# Patient Record
Sex: Female | Born: 1995 | Race: Black or African American | Hispanic: No | Marital: Single | Smoking: Never smoker
Health system: Southern US, Community
[De-identification: ages and names within clinical notes are randomized; demographics above are authoritative.]

## PROBLEM LIST (undated history)

## (undated) DIAGNOSIS — R519 Headache, unspecified: Secondary | ICD-10-CM

## (undated) DIAGNOSIS — O26649 Intrahepatic cholestasis of pregnancy, unspecified trimester: Secondary | ICD-10-CM

## (undated) DIAGNOSIS — F902 Attention-deficit hyperactivity disorder, combined type: Secondary | ICD-10-CM

## (undated) DIAGNOSIS — A599 Trichomoniasis, unspecified: Secondary | ICD-10-CM

## (undated) DIAGNOSIS — R51 Headache: Secondary | ICD-10-CM

## (undated) DIAGNOSIS — K831 Obstruction of bile duct: Secondary | ICD-10-CM

## (undated) DIAGNOSIS — J45909 Unspecified asthma, uncomplicated: Secondary | ICD-10-CM

## (undated) DIAGNOSIS — J309 Allergic rhinitis, unspecified: Secondary | ICD-10-CM

## (undated) DIAGNOSIS — L709 Acne, unspecified: Secondary | ICD-10-CM

## (undated) DIAGNOSIS — A6009 Herpesviral infection of other urogenital tract: Secondary | ICD-10-CM

## (undated) HISTORY — DX: Allergic rhinitis, unspecified: J30.9

## (undated) HISTORY — DX: Intrahepatic cholestasis of pregnancy, unspecified trimester: O26.649

## (undated) HISTORY — DX: Unspecified asthma, uncomplicated: J45.909

## (undated) HISTORY — DX: Headache, unspecified: R51.9

## (undated) HISTORY — PX: NO PAST SURGERIES: SHX2092

## (undated) HISTORY — DX: Acne, unspecified: L70.9

## (undated) HISTORY — DX: Attention-deficit hyperactivity disorder, combined type: F90.2

## (undated) HISTORY — DX: Herpesviral infection of other urogenital tract: A60.09

## (undated) HISTORY — DX: Headache: R51

## (undated) HISTORY — DX: Obstruction of bile duct: K83.1

## (undated) HISTORY — DX: Trichomoniasis, unspecified: A59.9

---
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
## 2017-01-27 ENCOUNTER — Ambulatory Visit: Payer: No Typology Code available for payment source | Admitting: Pediatrics

## 2017-03-25 ENCOUNTER — Ambulatory Visit
Admission: EM | Admit: 2017-03-25 | Discharge: 2017-03-25 | Disposition: A | Discharge status: Home / Self Care | Payer: No Typology Code available for payment source | Attending: Family Medicine | Admitting: Family Medicine

## 2017-03-25 DIAGNOSIS — Z975 Presence of (intrauterine) contraceptive device: Secondary | ICD-10-CM | POA: Diagnosis not present

## 2017-03-25 DIAGNOSIS — N939 Abnormal uterine and vaginal bleeding, unspecified: Secondary | ICD-10-CM

## 2017-03-25 DIAGNOSIS — N938 Other specified abnormal uterine and vaginal bleeding: Secondary | ICD-10-CM | POA: Diagnosis not present

## 2017-03-25 LAB — CBC WITH DIFFERENTIAL/PLATELET
BASOS ABS: 0.1 10*3/uL (ref 0–0.1)
Basophils Relative: 1 %
EOS ABS: 0.2 10*3/uL (ref 0–0.7)
Eosinophils Relative: 3 %
HCT: 39.1 % (ref 35.0–47.0)
HEMOGLOBIN: 13 g/dL (ref 12.0–16.0)
LYMPHS ABS: 2 10*3/uL (ref 1.0–3.6)
LYMPHS PCT: 22 %
MCH: 30.9 pg (ref 26.0–34.0)
MCHC: 33.2 g/dL (ref 32.0–36.0)
MCV: 92.9 fL (ref 80.0–100.0)
Monocytes Absolute: 0.9 10*3/uL (ref 0.2–0.9)
Monocytes Relative: 10 %
NEUTROS PCT: 64 %
Neutro Abs: 5.8 10*3/uL (ref 1.4–6.5)
PLATELETS: 229 10*3/uL (ref 150–440)
RBC: 4.21 MIL/uL (ref 3.80–5.20)
RDW: 12.9 % (ref 11.5–14.5)
WBC: 9 10*3/uL (ref 3.6–11.0)

## 2017-03-25 LAB — CHLAMYDIA/NGC RT PCR (ARMC ONLY)
CHLAMYDIA TR: NOT DETECTED
N gonorrhoeae: NOT DETECTED

## 2017-03-25 LAB — WET PREP, GENITAL
Clue Cells Wet Prep HPF POC: NONE SEEN
Sperm: NONE SEEN
Trich, Wet Prep: NONE SEEN
Yeast Wet Prep HPF POC: NONE SEEN

## 2017-03-25 LAB — HCG, QUANTITATIVE, PREGNANCY

## 2017-03-25 NOTE — ED Triage Notes (Signed)
Patient complains of vaginal bleeding since Sunday. Patient reports that she has a Nexplanon in and does not typically have menstrual cycles. Patient states that she has been bleeding constantly filling up pads and tampons every few hours. Patient states that she has been having cramps. Patient states that this seems different than menstrual cycles that she has had in the past.

## 2017-03-25 NOTE — ED Provider Notes (Signed)
[facility]    CSN: [id number] Arrival date & time: 03/25/17  4098     History   Chief Complaint Chief Complaint  Patient presents with  . Vaginal Bleeding    HPI Jasmine Thornton is a 21 y.o. female.   She is 21 year old black female gravida 0 para 0 presents with abnormal uterine/vaginal bleeding since Sunday. She reports that. His been very heavy. She reports having a subdermal implant for contraception for about 2 years. Usually they're good for 3-5 years in the first year she had a lot of breakthrough bleeding in the last year she's had no bleeding at all. She suddenly started having bleeding on Sunday. Along with vaginal bleeding she reports heavy menstrual cramping as well She is sexually active diagnosis slight discharge before the bleeding started and was having some abdominal discomfort as well so she was be also evaluated for STDs. Past smoker history ADHD acne asthma and headaches. No previous surgeries or operations. Mother allergies and anemia she never smoked.   The history is provided by the patient.  Vaginal Bleeding  Quality:  Bright red and clots Severity:  Severe Onset quality:  Sudden Duration:  3 days Timing:  Constant Progression:  Worsening Chronicity:  New Menstrual history:  Irregular Possible pregnancy: no   Relieved by:  Nothing Worsened by:  Nothing Ineffective treatments:  None tried Associated symptoms: abdominal pain, dyspareunia and vaginal discharge   Risk factors: STD exposure     Past Medical History:  Diagnosis Date  . Acne   . ADHD (attention deficit hyperactivity disorder), combined type   . Allergic rhinitis   . Asthma   . Headache     Patient Active Problem List   Diagnosis Date Noted  . Surveillance of implantable subdermal contraceptive 07/24/2015    Past Surgical History:  Procedure Laterality Date  . NO PAST SURGERIES      OB History    No data available       Home Medications    Prior to Admission  medications   Medication Sig Start Date End Date Taking? Authorizing Provider  Adapalene (DIFFERIN) 0.1 % LOTN Apply topically.   Yes [provider]  albuterol (PROVENTIL HFA;VENTOLIN HFA) 108 (90 BASE) MCG/ACT inhaler Inhale into the lungs every 6 (six) hours as needed for wheezing or shortness of breath.   Yes [provider]  beclomethasone (QVAR) 80 MCG/ACT inhaler Inhale into the lungs 2 (two) times daily.   Yes [provider]  cetirizine (ZYRTEC) 10 MG tablet Take 10 mg by mouth at bedtime. 06/05/15  Yes [provider]  fluticasone (FLONASE) 50 MCG/ACT nasal spray Place 1 spray into both nostrils daily. 04/01/15  Yes [provider]  clindamycin-benzoyl peroxide (BENZACLIN) gel Apply topically 2 (two) times daily.    [provider]  PATADAY 0.2 % SOLN PLACE 1 DROP INTO BOTH EYES EVERY DAY AS NEEDED 06/05/15   [provider]  predniSONE (DELTASONE) 20 MG tablet 3 tabs po day one, then 2 po daily x 4 days 03/05/16   Palumbo, April, MD  SUMAtriptan (IMITREX) 50 MG tablet Take 1 tab po at onset of headache. May repeat in 2 hours if headache persists or recurs. No more than 2 tabs in 24 hour period. 12/24/16   Payton Mccallum, MD    Family History Family History  Problem Relation Age of Onset  . Allergies Mother   . Anemia Mother   . Heart disease Maternal Grandmother  Social History Social History  Substance Use Topics  . Smoking status: Never Smoker  . Smokeless tobacco: Never Used  . Alcohol use No     Allergies   Patient has no known allergies.   Review of Systems Review of Systems  Gastrointestinal: Positive for abdominal pain.  Genitourinary: Positive for dyspareunia, vaginal bleeding and vaginal discharge.  All other systems reviewed and are negative.    Physical Exam Triage Vital Signs ED Triage Vitals  Enc Vitals Group     BP 03/25/17 0950 100/62     Pulse Rate 03/25/17 0950 87     Resp  03/25/17 0950 18     Temp 03/25/17 0950 98.3 F (36.8 C)     Temp Source 03/25/17 0950 Oral     SpO2 03/25/17 0950 100 %     Weight 03/25/17 0946 126 lb (57.2 kg)     Height 03/25/17 0946 5\' 2"  (1.575 m)     Head Circumference --      Peak Flow --      Pain Score 03/25/17 0946 7     Pain Loc --      Pain Edu? --      Excl. in GC? --    No data found.   Updated Vital Signs BP 100/62 (BP Location: Left Arm)   Pulse 87   Temp 98.3 F (36.8 C) (Oral)   Resp 18   Ht 5\' 2"  (1.575 m)   Wt 126 lb (57.2 kg)   SpO2 100%   BMI 23.05 kg/m   Visual Acuity Right Eye Distance:   Left Eye Distance:   Bilateral Distance:    Right Eye Near:   Left Eye Near:    Bilateral Near:     Physical Exam  Constitutional: She appears well-developed and well-nourished.  HENT:  Head: Normocephalic and atraumatic.  Eyes: Pupils are equal, round, and reactive to light.  Neck: Normal range of motion. Neck supple.  Pulmonary/Chest: Effort normal.  Abdominal: Soft. Bowel sounds are normal. She exhibits no distension. There is no tenderness. Hernia confirmed negative in the right inguinal area and confirmed negative in the left inguinal area.  Genitourinary: Rectum normal and uterus normal. Rectal exam shows no fissure and no tenderness. There is no rash on the right labia. There is no rash on the left labia. Uterus is not deviated and not enlarged. Cervix exhibits no motion tenderness and no friability. Right adnexum displays no mass and no tenderness. Left adnexum displays no mass and no tenderness. There is bleeding in the vagina. No signs of injury around the vagina.  Musculoskeletal: Normal range of motion.  Neurological: She is alert.  Skin: Skin is warm and dry.  Psychiatric: She has a normal mood and affect.  Vitals reviewed.    UC Treatments / Results  Labs (all labs ordered are listed, but only abnormal results are displayed) Labs Reviewed  WET PREP, GENITAL  CHLAMYDIA/NGC RT PCR  ([facility] ONLY)  CBC WITH DIFFERENTIAL/PLATELET  HCG, QUANTITATIVE, PREGNANCY  RPR  HIV ANTIBODY (ROUTINE TESTING)    EKG  EKG Interpretation None       Radiology No results found.  Procedures Procedures (including critical care time)  Medications Ordered in UC Medications - No data to display   Results for orders placed or performed during the hospital encounter of 03/25/17  Wet prep, genital  Result Value Ref Range   Yeast Wet Prep HPF POC NONE SEEN NONE SEEN   Trich, Wet Prep NONE  SEEN NONE SEEN   Clue Cells Wet Prep HPF POC NONE SEEN NONE SEEN   WBC, Wet Prep HPF POC FEW (A) NONE SEEN   Sperm NONE SEEN   CBC with Differential  Result Value Ref Range   WBC 9.0 3.6 - 11.0 K/uL   RBC 4.21 3.80 - 5.20 MIL/uL   Hemoglobin 13.0 12.0 - 16.0 g/dL   HCT 40.939.1 81.135.0 - 91.447.0 %   MCV 92.9 80.0 - 100.0 fL   MCH 30.9 26.0 - 34.0 pg   MCHC 33.2 32.0 - 36.0 g/dL   RDW 78.212.9 95.611.5 - 21.314.5 %   Platelets 229 150 - 440 K/uL   Neutrophils Relative % 64 %   Neutro Abs 5.8 1.4 - 6.5 K/uL   Lymphocytes Relative 22 %   Lymphs Abs 2.0 1.0 - 3.6 K/uL   Monocytes Relative 10 %   Monocytes Absolute 0.9 0.2 - 0.9 K/uL   Eosinophils Relative 3 %   Eosinophils Absolute 0.2 0 - 0.7 K/uL   Basophils Relative 1 %   Basophils Absolute 0.1 0 - 0.1 K/uL  hCG, quantitative, pregnancy  Result Value Ref Range   hCG, Beta Chain, Quant, S <1 <5 mIU/mL   Initial Impression / Assessment and Plan / UC Course  I have reviewed the triage vital signs and the nursing notes.  Pertinent labs & imaging results that were available during my care of the patient were reviewed by me and considered in my medical decision making (see chart for details).   at this time patient has vaginal bleeding but not pregnant and is no signs of marked anemia. GC and chlamydia test as well as HIV and RPR is pending. Recommend patient follow-up with PCP if not better in 2-3 weeks work today and tomorrow as well.  Final  Clinical Impressions(s) / UC Diagnoses   Final diagnoses:  Vaginal bleeding  Dysfunctional uterine bleeding    New Prescriptions New Prescriptions   No medications on file    Note: This dictation was prepared with Dragon dictation along with smaller phrase technology. Any transcriptional errors that result from this process are unintentional.   Hassan RowanWade, Timur Nibert, MD 03/25/17 1143

## 2017-03-26 LAB — RPR: RPR: NONREACTIVE

## 2017-03-26 LAB — HIV ANTIBODY (ROUTINE TESTING W REFLEX): HIV Screen 4th Generation wRfx: NONREACTIVE

## 2017-08-26 ENCOUNTER — Ambulatory Visit (INDEPENDENT_AMBULATORY_CARE_PROVIDER_SITE_OTHER): Admitting: Family

## 2017-08-26 ENCOUNTER — Encounter: Payer: Self-pay | Admitting: Family

## 2017-08-26 VITALS — BP 111/66 | HR 76 | Ht 62.25 in | Wt 138.8 lb

## 2017-08-26 DIAGNOSIS — Z113 Encounter for screening for infections with a predominantly sexual mode of transmission: Secondary | ICD-10-CM

## 2017-08-26 DIAGNOSIS — Z3046 Encounter for surveillance of implantable subdermal contraceptive: Secondary | ICD-10-CM

## 2017-08-26 NOTE — Patient Instructions (Signed)
Follow-up  in 1 month. Schedule this appointment before you leave clinic today.  Your Nexplanon was removed today and is no longer preventing pregnancy.  If you have sex, remember to use condoms to prevent pregnancy and to prevent sexually transmitted infections.  Leave the outside bandage on for 24 hours.  Leave the smaller bandages on for 3-5 days or until they fall off on their own.  Keep the area clean and dry for 3-5 days.  There is usually bruising or swelling at and around the removal site for a few days to a week after the removal.  If you see redness or pus draining from the removal site, call us immediately.  We would like you to return to the clinic for a follow-up visit in 1 month.  You can call [facility] 24 hours a day with any questions or concerns.  There is always a nurse or doctor available to take your call.  Call 9-1-1 if you have a life-threatening emergency.  For anything else, please call us at [phone] before heading to the ER.  

## 2017-08-27 LAB — C. TRACHOMATIS/N. GONORRHOEAE RNA
C. TRACHOMATIS RNA, TMA: NOT DETECTED
N. gonorrhoeae RNA, TMA: NOT DETECTED

## 2017-09-04 NOTE — Progress Notes (Signed)
THIS RECORD MAY CONTAIN CONFIDENTIAL INFORMATION THAT SHOULD NOT BE RELEASED WITHOUT REVIEW OF THE SERVICE PROVIDER.  Adolescent Medicine Consultation Follow-Up Visit Jasmine Thornton  is a 21 y.o. female referred by Melanie CrazierKramer, Minda, NP here today for follow-up regarding nexplanon removal.      Chief Complaint  Patient presents with  . Follow-up    HPI:    -not sexually active -desires nexplanon removal -declines another method at this time -no coercion; safe in all relationships  Review of Systems  Constitutional: Negative for malaise/fatigue.  Eyes: Negative for double vision.  Respiratory: Negative for shortness of breath.   Cardiovascular: Negative for chest pain and palpitations.  Gastrointestinal: Negative for abdominal pain, constipation, diarrhea, nausea and vomiting.  Genitourinary: Negative for dysuria.  Musculoskeletal: Negative for joint pain and myalgias.  Skin: Negative for rash.  Neurological: Negative for dizziness and headaches.  Endo/Heme/Allergies: Does not bruise/bleed easily.    No LMP recorded. Patient has had an implant. No Known Allergies Outpatient Medications Prior to Visit  Medication Sig Dispense Refill  . Adapalene (DIFFERIN) 0.1 % LOTN Apply topically.    Marland Kitchen. albuterol (PROVENTIL HFA;VENTOLIN HFA) 108 (90 BASE) MCG/ACT inhaler Inhale into the lungs every 6 (six) hours as needed for wheezing or shortness of breath.    . beclomethasone (QVAR) 80 MCG/ACT inhaler Inhale into the lungs 2 (two) times daily.    . cetirizine (ZYRTEC) 10 MG tablet Take 10 mg by mouth at bedtime.  12  . clindamycin-benzoyl peroxide (BENZACLIN) gel Apply topically 2 (two) times daily.    . fluticasone (FLONASE) 50 MCG/ACT nasal spray Place 1 spray into both nostrils daily.  12  . PATADAY 0.2 % SOLN PLACE 1 DROP INTO BOTH EYES EVERY DAY AS NEEDED  12  . predniSONE (DELTASONE) 20 MG tablet 3 tabs po day one, then 2 po daily x 4 days 11 tablet 0  . SUMAtriptan (IMITREX) 50 MG  tablet Take 1 tab po at onset of headache. May repeat in 2 hours if headache persists or recurs. No more than 2 tabs in 24 hour period. 6 tablet 0   No facility-administered medications prior to visit.      Patient Active Problem List   Diagnosis Date Noted  . Surveillance of implantable subdermal contraceptive 07/24/2015    Physical Exam:  Vitals:   08/26/17 1131  BP: 111/66  Pulse: 76  Weight: 138 lb 12.8 oz (63 kg)  Height: 5' 2.25" (1.581 m)   BP 111/66 (BP Location: Right Arm, Patient Position: Sitting, Cuff Size: Normal)   Pulse 76   Ht 5' 2.25" (1.581 m)   Wt 138 lb 12.8 oz (63 kg)   BMI 25.18 kg/m  Body mass index: body mass index is 25.18 kg/m. Blood pressure percentiles are 48 % systolic and 54 % diastolic based on the August 2017 AAP Clinical Practice Guideline. Blood pressure percentile targets: 90: 125/77, 95: 128/80, 95 + 12 mmHg: 140/92.  Physical Exam  Constitutional: She is oriented to person, place, and time. She appears well-developed. No distress.  HENT:  Head: Normocephalic and atraumatic.  Eyes: Pupils are equal, round, and reactive to light. EOM are normal. No scleral icterus.  Neck: Normal range of motion. Neck supple. No thyromegaly present.  Cardiovascular: Normal rate, regular rhythm, normal heart sounds and intact distal pulses.   No murmur heard. Pulmonary/Chest: Effort normal and breath sounds normal.  Abdominal: Soft.  Musculoskeletal: Normal range of motion. She exhibits no edema.  Lymphadenopathy:    She  has no cervical adenopathy.  Neurological: She is alert and oriented to person, place, and time. No cranial nerve deficit.  Skin: Skin is warm and dry. No rash noted.  Psychiatric: She has a normal mood and affect. Her behavior is normal. Judgment and thought content normal.   Assessment/Plan: 1. Encounter for Nexplanon removal Risks & benefits of Nexplanon removal discussed. Consent form signed.  The patient denies any allergies to  anesthetics or antiseptics.  Procedure: Pt was placed in supine position. left arm was flexed at the elbow and externally rotated so that her wrist was parallel to her ear, The device was palpated and marked. The site was cleaned with Betadine. The area surrounding the device was covered with a sterile drape. 1% lidocaine was injected just under the device. A scalpel was used to create a small incision. The device was pushed towards the incision. Fibrous tissue surrounding the device was gradually removed from the device. The device was removed and measured to ensure all 4 cm of device was removed. Steri-strips were used to close the incision. Pressure dressing was applied to the patient.  The patient was instructed to removed the pressure dressing in 24 hrs.  The patient was advised to move slowly from a supine to an upright position  The patient denied any concerns or complaints  The patient was instructed to schedule a follow-up appt in 1 month. The patient will be called in 1 week to address any concerns.  2. Routine screening for STI (sexually transmitted infection) -negative  - C. trachomatis/N. gonorrhoeae RNA  Follow-up:  Return in about 4 weeks (around 09/23/2017) for with Christianne Dolin, FNP-C, GYN/Reproductive Health concerns.   Medical decision-making:  >15 minutes spent face to face with patient with more than 50% of appointment spent discussing diagnosis, management, follow-up, and reviewing of nexplanon removal return precautions.

## 2017-09-29 ENCOUNTER — Ambulatory Visit: Payer: Self-pay | Admitting: Family

## 2017-10-12 ENCOUNTER — Ambulatory Visit: Payer: Self-pay | Admitting: Pediatrics

## 2017-11-27 ENCOUNTER — Other Ambulatory Visit: Payer: Self-pay

## 2017-11-27 ENCOUNTER — Encounter (HOSPITAL_BASED_OUTPATIENT_CLINIC_OR_DEPARTMENT_OTHER): Payer: Self-pay | Admitting: *Deleted

## 2017-11-27 DIAGNOSIS — Z3202 Encounter for pregnancy test, result negative: Secondary | ICD-10-CM | POA: Insufficient documentation

## 2017-11-27 DIAGNOSIS — R110 Nausea: Secondary | ICD-10-CM | POA: Diagnosis present

## 2017-11-27 DIAGNOSIS — Z79899 Other long term (current) drug therapy: Secondary | ICD-10-CM | POA: Insufficient documentation

## 2017-11-27 DIAGNOSIS — J45909 Unspecified asthma, uncomplicated: Secondary | ICD-10-CM | POA: Insufficient documentation

## 2017-11-27 NOTE — ED Triage Notes (Signed)
Pt c/o nausea and fatigue x 1 week . LMP 1/6

## 2017-11-28 ENCOUNTER — Emergency Department (HOSPITAL_BASED_OUTPATIENT_CLINIC_OR_DEPARTMENT_OTHER)
Admission: EM | Admit: 2017-11-28 | Discharge: 2017-11-28 | Disposition: A | Discharge status: Home / Self Care | Payer: Medicaid Other | Attending: Emergency Medicine | Admitting: Emergency Medicine

## 2017-11-28 DIAGNOSIS — Z3202 Encounter for pregnancy test, result negative: Secondary | ICD-10-CM

## 2017-11-28 LAB — URINALYSIS, ROUTINE W REFLEX MICROSCOPIC
BILIRUBIN URINE: NEGATIVE
Glucose, UA: NEGATIVE mg/dL
Ketones, ur: 40 mg/dL — AB
Leukocytes, UA: NEGATIVE
NITRITE: NEGATIVE
PROTEIN: NEGATIVE mg/dL
Specific Gravity, Urine: >1.03 — ABNORMAL HIGH (ref 1.005–1.030)
pH: 6 (ref 5.0–8.0)

## 2017-11-28 LAB — URINALYSIS, MICROSCOPIC (REFLEX)

## 2017-11-28 LAB — PREGNANCY, URINE: PREG TEST UR: NEGATIVE

## 2017-11-28 NOTE — Discharge Instructions (Addendum)
First response early result is a good over-the-counter pregnancy test.  If your menstrual cycle is late or he continue to have symptoms, you may take pregnancy test at home.  Your pregnancy test today was negative.    [email or web address] [address] [place], [place] [phone]    [facility] [email or web address] [address] [place], [place] [address](336) [phone]    [facility] [address] [place], [place] [phone]   [facility] [email or web address] [address] [place], [place] [phone]   [facility] [email or web address] [address] [place], [place] [phone]   [email or web address] [address] [place], [place] [phone]

## 2017-11-28 NOTE — ED Notes (Signed)
Pt states, "I just want a pregnancy test"

## 2017-11-28 NOTE — ED Provider Notes (Signed)
TIME SEEN: 12:04 AM  CHIEF COMPLAINT: "I am here for a pregnancy test"  HPI: Patient is a 22 year old female with history of asthma, ADHD who presents to the emergency department requesting a pregnancy test.  Reports for the past week she has had intermittent nausea, fatigue and breast pain.  No symptoms currently.  No abdominal pain, fevers, vomiting, diarrhea, vaginal bleeding, discharge or dysuria, hematuria.  Has never been pregnant before.  Is sexually active.  First day of her last menstrual cycle was January 3.  She has not missed any recent periods.  Did not take a pregnancy test at home.  ROS: See HPI Constitutional: no fever  Eyes: no drainage  ENT: no runny nose   Cardiovascular:  no chest pain  Resp: no SOB  GI: no vomiting GU: no dysuria Integumentary: no rash  Allergy: no hives  Musculoskeletal: no leg swelling  Neurological: no slurred speech ROS otherwise negative  PAST MEDICAL HISTORY/PAST SURGICAL HISTORY:  Past Medical History:  Diagnosis Date  . Acne   . ADHD (attention deficit hyperactivity disorder), combined type   . Allergic rhinitis   . Asthma   . Headache     MEDICATIONS:  Prior to Admission medications   Medication Sig Start Date End Date Taking? Authorizing Provider  Adapalene (DIFFERIN) 0.1 % LOTN Apply topically.    [provider]  albuterol (PROVENTIL HFA;VENTOLIN HFA) 108 (90 BASE) MCG/ACT inhaler Inhale into the lungs every 6 (six) hours as needed for wheezing or shortness of breath.    [provider]  beclomethasone (QVAR) 80 MCG/ACT inhaler Inhale into the lungs 2 (two) times daily.    [provider]  cetirizine (ZYRTEC) 10 MG tablet Take 10 mg by mouth at bedtime. 06/05/15   [provider]  fluticasone (FLONASE) 50 MCG/ACT nasal spray Place 1 spray into both nostrils daily. 04/01/15   [provider]  PATADAY 0.2 % SOLN PLACE 1 DROP INTO BOTH EYES EVERY DAY AS NEEDED 06/05/15   [provider]  SUMAtriptan (IMITREX) 50 MG tablet Take 1 tab po at onset of headache. May repeat in 2 hours if headache persists or recurs. No more than 2 tabs in 24 hour period. 12/24/16   Payton Mccallum, MD    ALLERGIES:  No Known Allergies  SOCIAL HISTORY:  Social History   Tobacco Use  . Smoking status: Never Smoker  . Smokeless tobacco: Never Used  Substance Use Topics  . Alcohol use: No    Alcohol/week: 0.0 oz    FAMILY HISTORY: Family History  Problem Relation Age of Onset  . Allergies Mother   . Anemia Mother   . Heart disease Maternal Grandmother     EXAM: BP 111/75   Pulse 80   Temp 98.2 F (36.8 C) (Oral)   Resp 18   Ht 5\' 2"  (1.575 m)   Wt 63.5 kg (140 lb)   SpO2 99%   BMI 25.61 kg/m  CONSTITUTIONAL: Alert and oriented and responds appropriately to questions. Well-appearing; well-nourished HEAD: Normocephalic EYES: Conjunctivae clear, pupils appear equal, EOMI ENT: normal nose; moist mucous membranes NECK: Supple, no meningismus, no nuchal rigidity, no LAD  CARD: RRR; S1 and S2 appreciated; no murmurs, no clicks, no rubs, no gallops RESP: Normal chest excursion without splinting or tachypnea; breath sounds clear and equal bilaterally; no wheezes, no rhonchi, no rales, no hypoxia or respiratory distress, speaking full sentences ABD/GI: Normal bowel sounds; non-distended; soft, non-tender, no rebound, no guarding, no peritoneal signs, no  hepatosplenomegaly BACK:  The back appears normal and is non-tender to palpation, there is no CVA tenderness EXT: Normal ROM in all joints; non-tender to palpation; no edema; normal capillary refill; no cyanosis, no calf tenderness or swelling    SKIN: Normal color for age and race; warm; no rash NEURO: Moves all extremities equally PSYCH: The patient's mood and manner are appropriate. Grooming and personal hygiene are appropriate.  MEDICAL DECISION MAKING: Patient here requesting a pregnancy test.  Completely  asymptomatic at this time.  Pregnancy test here is negative.  Urine shows ketones but no sign of infection.  Have encouraged her to increase her fluid intake at home.  Recommended if she misses a period or continues to have symptoms that she can take over-the-counter pregnancy test at home.  Given a list of OB/GYN for follow-up as needed per her request.  I feel she is safe for discharge without further emergent workup.  At this time, I do not feel there is any life-threatening condition present. I have reviewed and discussed all results (EKG, imaging, lab, urine as appropriate) and exam findings with patient/family. I have reviewed nursing notes and appropriate previous records.  I feel the patient is safe to be discharged home without further emergent workup and can continue workup as an outpatient as needed. Discussed usual and customary return precautions. Patient/family verbalize understanding and are comfortable with this plan.  Outpatient follow-up has been provided if needed. All questions have been answered.      Ward, Layla MawKristen N, DO 11/28/17 [phone]

## 2018-06-22 ENCOUNTER — Emergency Department (HOSPITAL_BASED_OUTPATIENT_CLINIC_OR_DEPARTMENT_OTHER)
Admission: EM | Admit: 2018-06-22 | Discharge: 2018-06-22 | Disposition: A | Discharge status: Home / Self Care | Payer: Medicaid Other | Attending: Emergency Medicine | Admitting: Emergency Medicine

## 2018-06-22 ENCOUNTER — Encounter (HOSPITAL_BASED_OUTPATIENT_CLINIC_OR_DEPARTMENT_OTHER): Payer: Self-pay | Admitting: *Deleted

## 2018-06-22 ENCOUNTER — Other Ambulatory Visit: Payer: Self-pay

## 2018-06-22 DIAGNOSIS — N10 Acute tubulo-interstitial nephritis: Secondary | ICD-10-CM | POA: Insufficient documentation

## 2018-06-22 DIAGNOSIS — J45909 Unspecified asthma, uncomplicated: Secondary | ICD-10-CM | POA: Insufficient documentation

## 2018-06-22 DIAGNOSIS — M5489 Other dorsalgia: Secondary | ICD-10-CM | POA: Diagnosis present

## 2018-06-22 DIAGNOSIS — Z79899 Other long term (current) drug therapy: Secondary | ICD-10-CM | POA: Diagnosis not present

## 2018-06-22 DIAGNOSIS — R300 Dysuria: Secondary | ICD-10-CM | POA: Insufficient documentation

## 2018-06-22 LAB — URINALYSIS, ROUTINE W REFLEX MICROSCOPIC
BILIRUBIN URINE: NEGATIVE
Glucose, UA: NEGATIVE mg/dL
NITRITE: NEGATIVE
PH: 6 (ref 5.0–8.0)
Protein, ur: 100 mg/dL — AB
SPECIFIC GRAVITY, URINE: 1.025 (ref 1.005–1.030)

## 2018-06-22 LAB — URINALYSIS, MICROSCOPIC (REFLEX): WBC, UA: >50 WBC/hpf (ref 0–5)

## 2018-06-22 LAB — PREGNANCY, URINE: Preg Test, Ur: NEGATIVE

## 2018-06-22 MED ORDER — SODIUM CHLORIDE 0.9 % IV SOLN
1.0000 g | Freq: Once | INTRAVENOUS | Status: AC
  Administered 2018-06-22: 1 g via INTRAVENOUS
  Filled 2018-06-22: qty 10

## 2018-06-22 MED ORDER — KETOROLAC TROMETHAMINE 30 MG/ML IJ SOLN
30.0000 mg | Freq: Once | INTRAMUSCULAR | Status: AC
  Administered 2018-06-22: 30 mg via INTRAVENOUS
  Filled 2018-06-22: qty 1

## 2018-06-22 MED ORDER — CEPHALEXIN 500 MG PO CAPS: 500.0000 mg | ORAL_CAPSULE | Freq: Four times a day (QID) | ORAL | 0 refills | Status: DC

## 2018-06-22 MED ORDER — SODIUM CHLORIDE 0.9 % IV BOLUS
1000.0000 mL | Freq: Once | INTRAVENOUS | Status: AC
  Administered 2018-06-22: 1000 mL via INTRAVENOUS

## 2018-06-22 NOTE — Discharge Instructions (Signed)
Your evaluated in the emergency department for back pain and urinary symptoms.  You have a urinary infection and we are treating you with antibiotics.  It will be important for you to stay well-hydrated and take Tylenol and ibuprofen as needed for pain.  Please return to the emergency department if any worsening symptoms.

## 2018-06-22 NOTE — ED Notes (Signed)
Pt. Reports she believes she had a UTI last week and tried to help herself by taking cranberry juice and water.  Pt.  Now having low back pain and blood in her urine.

## 2018-06-22 NOTE — ED Provider Notes (Signed)
[facility] EMERGENCY DEPARTMENT Provider Note   CSN: [id number] Arrival date & time: 06/22/18  1945     History   Chief Complaint Chief Complaint  Patient presents with  . Back Pain    HPI Jasmine Thornton is a 22 y.o. female.  She is been having some urinary frequency and dysuria for over a week.  She tried some cranberry juice and drinking lots of fluids.  She started with low back pain more right-sided than left and blood in her urine over the last couple of days.  She denies fevers or chills but has felt hot at times.  No nausea no vomiting.  No abdominal pain.  No chest pain or shortness of breath.  She denies any vaginal bleeding or discharge.  The history is provided by the patient.  Back Pain   This is a new problem. The current episode started 2 days ago. The problem occurs constantly. The problem has not changed since onset.The pain is associated with no known injury. Pain location: low back. The quality of the pain is described as aching. The pain does not radiate. The pain is moderate. The symptoms are aggravated by bending and twisting. Associated symptoms include dysuria. Pertinent negatives include no chest pain, no fever, no abdominal pain, no bowel incontinence, no perianal numbness, no bladder incontinence and no pelvic pain. She has tried nothing for the symptoms. The treatment provided no relief.    Past Medical History:  Diagnosis Date  . Acne   . ADHD (attention deficit hyperactivity disorder), combined type   . Allergic rhinitis   . Asthma   . Headache     Patient Active Problem List   Diagnosis Date Noted  . Surveillance of implantable subdermal contraceptive 07/24/2015    Past Surgical History:  Procedure Laterality Date  . NO PAST SURGERIES       OB History   None      Home Medications    Prior to Admission medications   Medication Sig Start Date End Date Taking? Authorizing Provider  Adapalene (DIFFERIN) 0.1 % LOTN Apply  topically.    [provider]  albuterol (PROVENTIL HFA;VENTOLIN HFA) 108 (90 BASE) MCG/ACT inhaler Inhale into the lungs every 6 (six) hours as needed for wheezing or shortness of breath.    [provider]  beclomethasone (QVAR) 80 MCG/ACT inhaler Inhale into the lungs 2 (two) times daily.    [provider]  cetirizine (ZYRTEC) 10 MG tablet Take 10 mg by mouth at bedtime. 06/05/15   [provider]  fluticasone (FLONASE) 50 MCG/ACT nasal spray Place 1 spray into both nostrils daily. 04/01/15   [provider]  PATADAY 0.2 % SOLN PLACE 1 DROP INTO BOTH EYES EVERY DAY AS NEEDED 06/05/15   [provider]  SUMAtriptan (IMITREX) 50 MG tablet Take 1 tab po at onset of headache. May repeat in 2 hours if headache persists or recurs. No more than 2 tabs in 24 hour period. 12/24/16   Payton Mccallumonty, [place], MD    Family History Family History  Problem Relation Age of Onset  . Allergies Mother   . Anemia Mother   . Heart disease Maternal Grandmother     Social History Social History   Tobacco Use  . Smoking status: Never Smoker  . Smokeless tobacco: Never Used  Substance Use Topics  . Alcohol use: No    Alcohol/week: 0.0 standard drinks  . Drug use: No     Allergies   Patient  has no known allergies.   Review of Systems Review of Systems  Constitutional: Negative for chills and fever.  HENT: Negative for sore throat.   Respiratory: Negative for shortness of breath.   Cardiovascular: Negative for chest pain.  Gastrointestinal: Negative for abdominal pain and bowel incontinence.  Genitourinary: Positive for dysuria. Negative for bladder incontinence and pelvic pain.  Musculoskeletal: Positive for back pain. Negative for neck pain.  Skin: Negative for rash.     Physical Exam Updated Vital Signs BP 121/88 (BP Location: Left Arm)   Pulse 84   Temp 98.1 F (36.7 C) (Oral)   Resp 20   Ht 5\' 2"  (1.575 m)   Wt 63.5 kg   LMP 06/01/2018    SpO2 100%   BMI 25.61 kg/m   Physical Exam  Constitutional: She appears well-developed and well-nourished.  HENT:  Head: Normocephalic and atraumatic.  Eyes: Conjunctivae are normal.  Neck: Neck supple.  Cardiovascular: Normal rate, regular rhythm and normal heart sounds.  Pulmonary/Chest: Effort normal. She has no wheezes. She has no rales.  Abdominal: Soft. She exhibits no mass. There is no tenderness. There is no guarding.  Musculoskeletal: Normal range of motion. She exhibits no deformity.  She has some mild lower paralumbar tenderness.  No CVA tenderness.  Neurological: She is alert. GCS eye subscore is 4. GCS verbal subscore is 5. GCS motor subscore is 6.  Skin: Skin is warm and dry. Capillary refill takes less than 2 seconds.  Psychiatric: She has a normal mood and affect.  Nursing note and vitals reviewed.    ED Treatments / Results  Labs (all labs ordered are listed, but only abnormal results are displayed) Labs Reviewed  URINALYSIS, ROUTINE W REFLEX MICROSCOPIC - Abnormal; Notable for the following components:      Result Value   APPearance CLOUDY (*)    Hgb urine dipstick LARGE (*)    Ketones, ur >80 (*)    Protein, ur 100 (*)    Leukocytes, UA LARGE (*)    All other components within normal limits  URINALYSIS, MICROSCOPIC (REFLEX) - Abnormal; Notable for the following components:   Bacteria, UA FEW (*)    All other components within normal limits  URINE CULTURE  PREGNANCY, URINE    EKG None  Radiology No results found.  Procedures Procedures (including critical care time)  Medications Ordered in ED Medications  ketorolac (TORADOL) 30 MG/ML injection 30 mg (has no administration in time range)  sodium chloride 0.9 % bolus 1,000 mL (has no administration in time range)  cefTRIAXone (ROCEPHIN) 1 g in sodium chloride 0.9 % 100 mL IVPB (has no administration in time range)     Initial Impression / Assessment and Plan / ED Course  I have reviewed the  triage vital signs and the nursing notes.  Pertinent labs & imaging results that were available during my care of the patient were reviewed by me and considered in my medical decision making (see chart for details).  Clinical Course as of Jun 23 1145  Tue Jun 22, 2018  715 22 year old female here with urinary symptoms and now worsening low back pain.  She is kind of diffusely tender in her back on exam not particularly CVA tenderness.  Her urine clearly looks infected with over 50 reds of 50 whites.  She is also got a large ketones so I think she would benefit from a dose of IV antibiotics and IV fluids.  Patency test is negative.  After her medications have  been administered she feels improved and is comfortable for discharge.   [MB]    Clinical Course User Index [MB] Terrilee FilesButler, Elison Worrel C, MD     Final Clinical Impressions(s) / ED Diagnoses   Final diagnoses:  Pyelonephritis, acute    ED Discharge Orders         Ordered    cephALEXin (KEFLEX) 500 MG capsule  4 times daily     06/22/18 2314           Terrilee FilesButler, Marco Raper C, MD 06/23/18 1146

## 2018-06-22 NOTE — ED Triage Notes (Signed)
Hematuria and back pain.

## 2018-06-25 LAB — URINE CULTURE
Culture: >=100000 — AB
SPECIAL REQUESTS: NORMAL

## 2018-06-26 ENCOUNTER — Telehealth: Payer: Self-pay

## 2018-06-26 NOTE — Telephone Encounter (Signed)
Post ED Visit - Positive Culture Follow-up  Culture report reviewed by antimicrobial stewardship pharmacist:  []  Jasmine Thornton, Pharm.Thornton. []  Jasmine Thornton, Pharm.Thornton., BCPS AQ-ID []  Jasmine Thornton, Pharm.Thornton., BCPS []  Jasmine Thornton, Pharm.Thornton., BCPS []  Jasmine Thornton, [place]Thorn[place]., BCPS, AAHIVP []  Jasmine Thornton, Pharm.Thornton., BCPS, AAHIVP []  Jasmine Thornton, Jasmine, BCPS []  Jasmine Thornton, Jasmine, BCPS []  Jasmine Thornton, Jasmine, BCPS []  Jasmine Thornton, Jasmine Thornton Positive urine culture Treated with Cephalexin, organism sensitive to the same and no further patient follow-up is required at this time.  Jerry CarasCullom, [name] 06/26/2018, 11:14 AM

## 2018-10-29 ENCOUNTER — Encounter (HOSPITAL_BASED_OUTPATIENT_CLINIC_OR_DEPARTMENT_OTHER): Payer: Self-pay | Admitting: *Deleted

## 2018-10-29 ENCOUNTER — Other Ambulatory Visit: Payer: Self-pay

## 2018-10-29 ENCOUNTER — Emergency Department (HOSPITAL_BASED_OUTPATIENT_CLINIC_OR_DEPARTMENT_OTHER): Payer: Medicaid Other

## 2018-10-29 ENCOUNTER — Emergency Department (HOSPITAL_BASED_OUTPATIENT_CLINIC_OR_DEPARTMENT_OTHER)
Admission: EM | Admit: 2018-10-29 | Discharge: 2018-10-29 | Disposition: A | Discharge status: Home / Self Care | Payer: Medicaid Other | Attending: Emergency Medicine | Admitting: Emergency Medicine

## 2018-10-29 DIAGNOSIS — J069 Acute upper respiratory infection, unspecified: Secondary | ICD-10-CM | POA: Insufficient documentation

## 2018-10-29 DIAGNOSIS — R05 Cough: Secondary | ICD-10-CM | POA: Diagnosis present

## 2018-10-29 DIAGNOSIS — Z79899 Other long term (current) drug therapy: Secondary | ICD-10-CM | POA: Diagnosis not present

## 2018-10-29 DIAGNOSIS — J45909 Unspecified asthma, uncomplicated: Secondary | ICD-10-CM | POA: Diagnosis not present

## 2018-10-29 DIAGNOSIS — J209 Acute bronchitis, unspecified: Secondary | ICD-10-CM | POA: Insufficient documentation

## 2018-10-29 DIAGNOSIS — B9789 Other viral agents as the cause of diseases classified elsewhere: Secondary | ICD-10-CM | POA: Insufficient documentation

## 2018-10-29 MED ORDER — ACETAMINOPHEN-CODEINE 120-12 MG/5ML PO SOLN: 10.0000 mL | ORAL | 0 refills | Status: DC | PRN

## 2018-10-29 MED ORDER — GUAIFENESIN ER 1200 MG PO TB12: 1.0000 | ORAL_TABLET | Freq: Two times a day (BID) | ORAL | 0 refills | Status: DC

## 2018-10-29 MED ORDER — PREDNISONE 50 MG PO TABS: 50.0000 mg | ORAL_TABLET | Freq: Every day | ORAL | 0 refills | Status: DC

## 2018-10-29 MED ORDER — IPRATROPIUM-ALBUTEROL 0.5-2.5 (3) MG/3ML IN SOLN
3.0000 mL | Freq: Once | RESPIRATORY_TRACT | Status: AC
  Administered 2018-10-29: 3 mL via RESPIRATORY_TRACT
  Filled 2018-10-29: qty 3

## 2018-10-29 MED ORDER — IBUPROFEN 400 MG PO TABS
600.0000 mg | ORAL_TABLET | Freq: Once | ORAL | Status: AC
  Administered 2018-10-29: 600 mg via ORAL
  Filled 2018-10-29: qty 1

## 2018-10-29 NOTE — Discharge Instructions (Addendum)
Return here as needed. Follow up with your doctor. Your x-ray was normal.

## 2018-10-29 NOTE — ED Triage Notes (Signed)
Pt reports cough x 3 days, subjective wheezing yesterday, cont with cough today. Her dad purchased an inhaler at the pharmacy yesterday that she states helped a lot, and she feels better today but cont with cough.

## 2018-11-01 NOTE — ED Provider Notes (Signed)
[facility] EMERGENCY DEPARTMENT Provider Note   CSN: [id number] Arrival date & time: 10/29/18  1033     History   Chief Complaint Chief Complaint  Patient presents with  . Cough    HPI Jasmine Thornton is a 22 y.o. female.  HPI Patient presents to the emergency department with cough and felt like she was wheezing yesterday.  The patient has asthma and her father went and got her inhaler prescription filled she states that she feels better but continues with the cough.  Patient states nothing seems to make the condition worse.  The patient denies chest pain,  headache,blurred vision, neck pain, fever, cough, weakness, numbness, dizziness, anorexia, edema, abdominal pain, nausea, vomiting, diarrhea, rash, back pain, dysuria, hematemesis, bloody stool, near syncope, or syncope. Past Medical History:  Diagnosis Date  . Acne   . ADHD (attention deficit hyperactivity disorder), combined type   . Allergic rhinitis   . Asthma   . Headache     Patient Active Problem List   Diagnosis Date Noted  . Surveillance of implantable subdermal contraceptive 07/24/2015    Past Surgical History:  Procedure Laterality Date  . NO PAST SURGERIES       OB History   No obstetric history on file.      Home Medications    Prior to Admission medications   Medication Sig Start Date End Date Taking? Authorizing Provider  acetaminophen-codeine 120-12 MG/5ML solution Take 10 mLs by mouth every 4 (four) hours as needed for moderate pain. 10/29/18   Donna Snooks, Cristal Deerhristopher, PA-C  Adapalene (DIFFERIN) 0.1 % LOTN Apply topically.    [provider]  albuterol (PROVENTIL HFA;VENTOLIN HFA) 108 (90 BASE) MCG/ACT inhaler Inhale into the lungs every 6 (six) hours as needed for wheezing or shortness of breath.    [provider]  beclomethasone (QVAR) 80 MCG/ACT inhaler Inhale into the lungs 2 (two) times daily.    [provider]  cephALEXin (KEFLEX) 500 MG capsule Take 1  capsule (500 mg total) by mouth 4 (four) times daily. 06/22/18   Terrilee FilesButler, Michael C, MD  cetirizine (ZYRTEC) 10 MG tablet Take 10 mg by mouth at bedtime. 06/05/15   [provider]  fluticasone (FLONASE) 50 MCG/ACT nasal spray Place 1 spray into both nostrils daily. 04/01/15   [provider]  Guaifenesin 1200 MG TB12 Take 1 tablet (1,200 mg total) by mouth 2 (two) times daily. 10/29/18   Jacqueleen Pulver, Cristal Deerhristopher, PA-C  PATADAY 0.2 % SOLN PLACE 1 DROP INTO BOTH EYES EVERY DAY AS NEEDED 06/05/15   [provider]  predniSONE (DELTASONE) 50 MG tablet Take 1 tablet (50 mg total) by mouth daily. 10/29/18   Maddyn Lieurance, Cristal Deerhristopher, PA-C  SUMAtriptan (IMITREX) 50 MG tablet Take 1 tab po at onset of headache. May repeat in 2 hours if headache persists or recurs. No more than 2 tabs in 24 hour period. 12/24/16   Payton Mccallumonty, [place], MD    Family History Family History  Problem Relation Age of Onset  . Allergies Mother   . Anemia Mother   . Heart disease Maternal Grandmother     Social History Social History   Tobacco Use  . Smoking status: Never Smoker  . Smokeless tobacco: Never Used  Substance Use Topics  . Alcohol use: No    Alcohol/week: 0.0 standard drinks  . Drug use: No     Allergies   Patient has no known allergies.   Review of Systems Review of Systems All other systems negative  except as documented in the HPI. All pertinent positives and negatives as reviewed in the HPI.  Physical Exam Updated Vital Signs BP 121/88   Pulse 75   Temp 98.6 F (37 C) (Oral)   Resp 18   Ht 5\' 2"  (1.575 m)   Wt 61.2 kg   LMP 10/19/2018   SpO2 100%   BMI 24.69 kg/m   Physical Exam Vitals signs and nursing note reviewed.  Constitutional:      General: She is not in acute distress.    Appearance: She is well-developed.  HENT:     Head: Normocephalic and atraumatic.  Eyes:     Pupils: Pupils are equal, round, and reactive to light.  Neck:     Musculoskeletal: Normal  range of motion and neck supple.  Cardiovascular:     Rate and Rhythm: Normal rate and regular rhythm.     Heart sounds: Normal heart sounds. No murmur. No friction rub. No gallop.   Pulmonary:     Effort: Pulmonary effort is normal. No respiratory distress.     Breath sounds: Normal breath sounds. No stridor. No wheezing, rhonchi or rales.  Abdominal:     General: Bowel sounds are normal. There is no distension.     Palpations: Abdomen is soft.     Tenderness: There is no abdominal tenderness.  Skin:    General: Skin is warm and dry.     Capillary Refill: Capillary refill takes less than 2 seconds.     Findings: No erythema or rash.  Neurological:     Mental Status: She is alert and oriented to person, place, and time.     Motor: No abnormal muscle tone.     Coordination: Coordination normal.  Psychiatric:        Behavior: Behavior normal.      ED Treatments / Results  Labs (all labs ordered are listed, but only abnormal results are displayed) Labs Reviewed - No data to display  EKG None  Radiology No results found.  Procedures Procedures (including critical care time)  Medications Ordered in ED Medications  ibuprofen (ADVIL,MOTRIN) tablet 600 mg (600 mg Oral Given 10/29/18 1410)  ipratropium-albuterol (DUONEB) 0.5-2.5 (3) MG/3ML nebulizer solution 3 mL (3 mLs Nebulization Given 10/29/18 1410)     Initial Impression / Assessment and Plan / ED Course  I have reviewed the triage vital signs and the nursing notes.  Pertinent labs & imaging results that were available during my care of the patient were reviewed by me and considered in my medical decision making (see chart for details).     We will be treated for an upper respiratory infection along with asthma exacerbation.  Patient is advised to return here as needed also advised her to increase her fluid intake and rest as much as possible.  Final Clinical Impressions(s) / ED Diagnoses   Final diagnoses:    Viral URI with cough  Acute bronchitis, unspecified organism    ED Discharge Orders         Ordered    predniSONE (DELTASONE) 50 MG tablet  Daily     10/29/18 1513    Guaifenesin 1200 MG TB12  2 times daily     10/29/18 1513    acetaminophen-codeine 120-12 MG/5ML solution  Every 4 hours PRN     10/29/18 1513           Charlestine NightLawyer, Makhia Vosler, PA-C 11/01/18 0033    Tilden Fossaees, Elizabeth, MD 11/03/18 [phone]

## 2018-12-15 ENCOUNTER — Emergency Department (HOSPITAL_BASED_OUTPATIENT_CLINIC_OR_DEPARTMENT_OTHER)
Admission: EM | Admit: 2018-12-15 | Discharge: 2018-12-15 | Disposition: A | Discharge status: Home / Self Care | Payer: Medicaid Other | Attending: Emergency Medicine | Admitting: Emergency Medicine

## 2018-12-15 ENCOUNTER — Encounter (HOSPITAL_BASED_OUTPATIENT_CLINIC_OR_DEPARTMENT_OTHER): Payer: Self-pay | Admitting: Emergency Medicine

## 2018-12-15 ENCOUNTER — Other Ambulatory Visit: Payer: Self-pay

## 2018-12-15 DIAGNOSIS — R05 Cough: Secondary | ICD-10-CM | POA: Insufficient documentation

## 2018-12-15 DIAGNOSIS — Z79899 Other long term (current) drug therapy: Secondary | ICD-10-CM | POA: Diagnosis not present

## 2018-12-15 DIAGNOSIS — R0981 Nasal congestion: Secondary | ICD-10-CM | POA: Diagnosis not present

## 2018-12-15 DIAGNOSIS — R51 Headache: Secondary | ICD-10-CM | POA: Insufficient documentation

## 2018-12-15 DIAGNOSIS — J45909 Unspecified asthma, uncomplicated: Secondary | ICD-10-CM | POA: Diagnosis not present

## 2018-12-15 MED ORDER — ACETAMINOPHEN-CODEINE 120-12 MG/5ML PO SOLN: 10.0000 mL | ORAL | 0 refills | Status: DC | PRN

## 2018-12-15 MED ORDER — OXYMETAZOLINE HCL 0.05 % NA SOLN
1.0000 | Freq: Once | NASAL | Status: AC
  Administered 2018-12-15: 1 via NASAL
  Filled 2018-12-15: qty 30

## 2018-12-15 MED ORDER — FLUTICASONE PROPIONATE 50 MCG/ACT NA SUSP: 1.0000 | Freq: Every day | NASAL | 0 refills | Status: DC

## 2018-12-15 NOTE — ED Provider Notes (Signed)
[facility] EMERGENCY DEPARTMENT Provider Note   CSN: [id number] Arrival date & time: 12/15/18  1950     History   Chief Complaint Chief Complaint  Patient presents with  . Nasal Congestion    HPI Jasmine Thornton is a 23 y.o. female history of ADHD, allergic rhinitis here presenting with cough, congestion, facial pain.  Patient has intermittent cough and congestion for the last month or so.  Patient did come to the ED about a month ago and was thought to have a viral URI with cough.  Prescribed prednisone and cough medicine but never filled it as she got better.  For the last 3 to 4 days she noticed worsening sinus congestion as well as facial pain.  She also has some postnasal drip as well as nonproductive cough.  Denies any fevers or chills.  The history is provided by the patient.    Past Medical History:  Diagnosis Date  . Acne   . ADHD (attention deficit hyperactivity disorder), combined type   . Allergic rhinitis   . Asthma   . Headache     Patient Active Problem List   Diagnosis Date Noted  . Surveillance of implantable subdermal contraceptive 07/24/2015    Past Surgical History:  Procedure Laterality Date  . NO PAST SURGERIES       OB History   No obstetric history on file.      Home Medications    Prior to Admission medications   Medication Sig Start Date End Date Taking? Authorizing Provider  acetaminophen-codeine 120-12 MG/5ML solution Take 10 mLs by mouth every 4 (four) hours as needed for moderate pain. 10/29/18   Lawyer, Cristal Deer, PA-C  Adapalene (DIFFERIN) 0.1 % LOTN Apply topically.    [provider]  albuterol (PROVENTIL HFA;VENTOLIN HFA) 108 (90 BASE) MCG/ACT inhaler Inhale into the lungs every 6 (six) hours as needed for wheezing or shortness of breath.    [provider]  beclomethasone (QVAR) 80 MCG/ACT inhaler Inhale into the lungs 2 (two) times daily.    [provider]  cephALEXin (KEFLEX) 500 MG  capsule Take 1 capsule (500 mg total) by mouth 4 (four) times daily. 06/22/18   Terrilee Files, MD  cetirizine (ZYRTEC) 10 MG tablet Take 10 mg by mouth at bedtime. 06/05/15   [provider]  fluticasone (FLONASE) 50 MCG/ACT nasal spray Place 1 spray into both nostrils daily. 04/01/15   [provider]  Guaifenesin 1200 MG TB12 Take 1 tablet (1,200 mg total) by mouth 2 (two) times daily. 10/29/18   Lawyer, Cristal Deer, PA-C  PATADAY 0.2 % SOLN PLACE 1 DROP INTO BOTH EYES EVERY DAY AS NEEDED 06/05/15   [provider]  predniSONE (DELTASONE) 50 MG tablet Take 1 tablet (50 mg total) by mouth daily. 10/29/18   Lawyer, Cristal Deer, PA-C  SUMAtriptan (IMITREX) 50 MG tablet Take 1 tab po at onset of headache. May repeat in 2 hours if headache persists or recurs. No more than 2 tabs in 24 hour period. 12/24/16   Payton Mccallum, MD    Family History Family History  Problem Relation Age of Onset  . Allergies Mother   . Anemia Mother   . Heart disease Maternal Grandmother     Social History Social History   Tobacco Use  . Smoking status: Never Smoker  . Smokeless tobacco: Never Used  Substance Use Topics  . Alcohol use: No    Alcohol/week: 0.0 standard drinks  . Drug use: No  Allergies   Patient has no known allergies.   Review of Systems Review of Systems  Respiratory: Positive for cough.   All other systems reviewed and are negative.    Physical Exam Updated Vital Signs BP 112/78 (BP Location: Right Arm)   Pulse 99   Temp 98.2 F (36.8 C) (Oral)   Resp 18   Ht 5\' 2"  (1.575 m)   Wt 63.5 kg   LMP 12/15/2018   SpO2 100%   BMI 25.61 kg/m   Physical Exam Vitals signs and nursing note reviewed.  HENT:     Head: Normocephalic.     Comments: Mild bilateral maxillary sinus tenderness     Ears:     Comments: Bilateral TM slightly bulging but not red     Mouth/Throat:     Mouth: Mucous membranes are moist.     Comments: Posterior pharynx  clear  Eyes:     Extraocular Movements: Extraocular movements intact.     Pupils: Pupils are equal, round, and reactive to light.  Neck:     Musculoskeletal: Normal range of motion.     Comments: Mild bilateral cervical LAD  Cardiovascular:     Rate and Rhythm: Normal rate and regular rhythm.     Pulses: Normal pulses.  Pulmonary:     Effort: Pulmonary effort is normal.     Breath sounds: Normal breath sounds.  Abdominal:     General: Abdomen is flat.  Musculoskeletal: Normal range of motion.  Skin:    General: Skin is warm.     Capillary Refill: Capillary refill takes less than 2 seconds.  Neurological:     General: No focal deficit present.     Mental Status: She is alert and oriented to person, place, and time.  Psychiatric:        Mood and Affect: Mood normal.        Behavior: Behavior normal.      ED Treatments / Results  Labs (all labs ordered are listed, but only abnormal results are displayed) Labs Reviewed - No data to display  EKG None  Radiology No results found.  Procedures Procedures (including critical care time)  Medications Ordered in ED Medications  oxymetazoline (AFRIN) 0.05 % nasal spray 1 spray (has no administration in time range)     Initial Impression / Assessment and Plan / ED Course  I have reviewed the triage vital signs and the nursing notes.  Pertinent labs & imaging results that were available during my care of the patient were reviewed by me and considered in my medical decision making (see chart for details).    Jasmine Thornton is a 23 y.o. female here with cough, congestion, sinus tenderness. Has hx of allergic rhinitis and I think likely either seasonal allergies or abacterial sinusitis. Afebrile, well appearing. Will try flonase, cough medicine prn. Will give afrin for short term relief     Final Clinical Impressions(s) / ED Diagnoses   Final diagnoses:  None    ED Discharge Orders    None       Charlynne Pander,  MD 12/15/18 [id number]

## 2018-12-15 NOTE — ED Triage Notes (Signed)
Reports congestion and cough x 1 month.

## 2018-12-15 NOTE — Discharge Instructions (Signed)
Try flonase for congestion.   Try cough medicine to help with cough.   You were given afrin. You may use it 3 times daily as needed for congestion. Don't use it more than 3 days.   Stay hydrated.   See your doctor  Return to ER if you have worse congestion, sinus pain, fever, trouble breathing, worse cough.

## 2019-10-06 ENCOUNTER — Encounter (HOSPITAL_BASED_OUTPATIENT_CLINIC_OR_DEPARTMENT_OTHER): Payer: Self-pay | Admitting: Emergency Medicine

## 2019-10-06 ENCOUNTER — Emergency Department (HOSPITAL_BASED_OUTPATIENT_CLINIC_OR_DEPARTMENT_OTHER)
Admission: EM | Admit: 2019-10-06 | Discharge: 2019-10-06 | Disposition: A | Discharge status: Home / Self Care | Payer: Medicaid Other | Attending: Emergency Medicine | Admitting: Emergency Medicine

## 2019-10-06 ENCOUNTER — Other Ambulatory Visit: Payer: Self-pay

## 2019-10-06 ENCOUNTER — Ambulatory Visit (HOSPITAL_BASED_OUTPATIENT_CLINIC_OR_DEPARTMENT_OTHER): Payer: Medicaid Other

## 2019-10-06 DIAGNOSIS — O200 Threatened abortion: Secondary | ICD-10-CM | POA: Diagnosis not present

## 2019-10-06 DIAGNOSIS — O98311 Other infections with a predominantly sexual mode of transmission complicating pregnancy, first trimester: Secondary | ICD-10-CM | POA: Diagnosis not present

## 2019-10-06 DIAGNOSIS — Z3A01 Less than 8 weeks gestation of pregnancy: Secondary | ICD-10-CM | POA: Diagnosis not present

## 2019-10-06 DIAGNOSIS — R8271 Bacteriuria: Secondary | ICD-10-CM

## 2019-10-06 DIAGNOSIS — O99511 Diseases of the respiratory system complicating pregnancy, first trimester: Secondary | ICD-10-CM | POA: Insufficient documentation

## 2019-10-06 DIAGNOSIS — O209 Hemorrhage in early pregnancy, unspecified: Secondary | ICD-10-CM | POA: Diagnosis present

## 2019-10-06 DIAGNOSIS — J45909 Unspecified asthma, uncomplicated: Secondary | ICD-10-CM | POA: Diagnosis not present

## 2019-10-06 DIAGNOSIS — Z79899 Other long term (current) drug therapy: Secondary | ICD-10-CM | POA: Diagnosis not present

## 2019-10-06 DIAGNOSIS — O99891 Other specified diseases and conditions complicating pregnancy: Secondary | ICD-10-CM

## 2019-10-06 DIAGNOSIS — O2341 Unspecified infection of urinary tract in pregnancy, first trimester: Secondary | ICD-10-CM | POA: Diagnosis not present

## 2019-10-06 DIAGNOSIS — A599 Trichomoniasis, unspecified: Secondary | ICD-10-CM

## 2019-10-06 LAB — ABO/RH: ABO/RH(D): O POS

## 2019-10-06 LAB — CBC
HCT: 35 % — ABNORMAL LOW (ref 36.0–46.0)
Hemoglobin: 11.7 g/dL — ABNORMAL LOW (ref 12.0–15.0)
MCH: 32 pg (ref 26.0–34.0)
MCHC: 33.4 g/dL (ref 30.0–36.0)
MCV: 95.6 fL (ref 80.0–100.0)
Platelets: 247 10*3/uL (ref 150–400)
RBC: 3.66 MIL/uL — ABNORMAL LOW (ref 3.87–5.11)
RDW: 11.9 % (ref 11.5–15.5)
WBC: 8.8 10*3/uL (ref 4.0–10.5)
nRBC: 0 % (ref 0.0–0.2)

## 2019-10-06 LAB — URINALYSIS, MICROSCOPIC (REFLEX)

## 2019-10-06 LAB — BASIC METABOLIC PANEL
Anion gap: 5 (ref 5–15)
BUN: 15 mg/dL (ref 6–20)
CO2: 20 mmol/L — ABNORMAL LOW (ref 22–32)
Calcium: 8.7 mg/dL — ABNORMAL LOW (ref 8.9–10.3)
Chloride: 107 mmol/L (ref 98–111)
Creatinine, Ser: 0.86 mg/dL (ref 0.44–1.00)
GFR calc Af Amer: >60 mL/min (ref >60)
GFR calc non Af Amer: >60 mL/min (ref >60)
Glucose, Bld: 84 mg/dL (ref 70–99)
Potassium: 3.6 mmol/L (ref 3.5–5.1)
Sodium: 132 mmol/L — ABNORMAL LOW (ref 135–145)

## 2019-10-06 LAB — URINALYSIS, ROUTINE W REFLEX MICROSCOPIC
Bilirubin Urine: NEGATIVE
Glucose, UA: NEGATIVE mg/dL
Ketones, ur: 40 mg/dL — AB
Leukocytes,Ua: NEGATIVE
Nitrite: NEGATIVE
Protein, ur: NEGATIVE mg/dL
Specific Gravity, Urine: 1.025 (ref 1.005–1.030)
pH: 6.5 (ref 5.0–8.0)

## 2019-10-06 LAB — HCG, QUANTITATIVE, PREGNANCY: hCG, Beta Chain, Quant, S: 42404 m[IU]/mL — ABNORMAL HIGH (ref <5)

## 2019-10-06 MED ORDER — CEPHALEXIN 500 MG PO CAPS: 500.0000 mg | ORAL_CAPSULE | Freq: Three times a day (TID) | ORAL | 0 refills | Status: AC

## 2019-10-06 MED ORDER — METRONIDAZOLE 500 MG PO TABS: 2000.0000 mg | ORAL_TABLET | Freq: Once | ORAL | 0 refills | Status: AC

## 2019-10-06 NOTE — ED Notes (Signed)
Outpatient Ultrasound scheduled for tomorrow at noon.  Pt voices understanding.

## 2019-10-06 NOTE — ED Triage Notes (Signed)
[redacted] weeks pregnant. She noticed a small amount of spotting when she wiped 10 min ago

## 2019-10-06 NOTE — ED Notes (Signed)
ED Provider at bedside discussing test results and dispo plan of care. 

## 2019-10-06 NOTE — ED Provider Notes (Signed)
[facility] EMERGENCY DEPARTMENT Provider Note   CSN: [id number] Arrival date & time: 10/06/19  1456     History   Chief Complaint Chief Complaint  Patient presents with  . Vaginal Bleeding    [redacted] weeks pregnant    HPI Ghadeer Kastelic is a 23 y.o. female.  G1 P0 at 7weeks presents to ER with vaginal bleeding.  Patient reports noted small vaginal bleeding after wiping earlier.  No ongoing bleeding.  No abdominal cramping, no abdominal pain.  No nausea or vomiting.  No new vaginal discharge, no dysuria.  She denies any chronic medical problems, no complications none with this pregnancy.  Has been seen by OB, has not had ultrasound yet.     HPI  Past Medical History:  Diagnosis Date  . Acne   . ADHD (attention deficit hyperactivity disorder), combined type   . Allergic rhinitis   . Asthma   . Headache     Patient Active Problem List   Diagnosis Date Noted  . Surveillance of implantable subdermal contraceptive 07/24/2015    Past Surgical History:  Procedure Laterality Date  . NO PAST SURGERIES       OB History    Gravida  1   Para      Term      Preterm      AB      Living        SAB      TAB      Ectopic      Multiple      Live Births               Home Medications    Prior to Admission medications   Medication Sig Start Date End Date Taking? Authorizing Provider  acetaminophen-codeine 120-12 MG/5ML solution Take 10 mLs by mouth every 4 (four) hours as needed for moderate pain. 12/15/18   Charlynne Pander, MD  Adapalene (DIFFERIN) 0.1 % LOTN Apply topically.    [provider]  albuterol (PROVENTIL HFA;VENTOLIN HFA) 108 (90 BASE) MCG/ACT inhaler Inhale into the lungs every 6 (six) hours as needed for wheezing or shortness of breath.    [provider]  beclomethasone (QVAR) 80 MCG/ACT inhaler Inhale into the lungs 2 (two) times daily.    [provider]  cephALEXin (KEFLEX) 500 MG capsule Take 1 capsule  (500 mg total) by mouth 3 (three) times daily for 7 days. 10/06/19 10/13/19  Milagros Loll, MD  cetirizine (ZYRTEC) 10 MG tablet Take 10 mg by mouth at bedtime. 06/05/15   [provider]  fluticasone (FLONASE) 50 MCG/ACT nasal spray Place 1 spray into both nostrils daily. 12/15/18   Charlynne Pander, MD  Guaifenesin 1200 MG TB12 Take 1 tablet (1,200 mg total) by mouth 2 (two) times daily. 10/29/18   Lawyer, Cristal Deer, PA-C  metroNIDAZOLE (FLAGYL) 500 MG tablet Take 4 tablets (2,000 mg total) by mouth once for 1 dose. 10/06/19 10/06/19  Milagros Loll, MD  PATADAY 0.2 % SOLN PLACE 1 DROP INTO BOTH EYES EVERY DAY AS NEEDED 06/05/15   [provider]  predniSONE (DELTASONE) 50 MG tablet Take 1 tablet (50 mg total) by mouth daily. 10/29/18   Lawyer, Cristal Deer, PA-C  SUMAtriptan (IMITREX) 50 MG tablet Take 1 tab po at onset of headache. May repeat in 2 hours if headache persists or recurs. No more than 2 tabs in 24 hour period. 12/24/16   Payton Mccallum, MD    Family History Family  History  Problem Relation Age of Onset  . Allergies Mother   . Anemia Mother   . Heart disease Maternal Grandmother     Social History Social History   Tobacco Use  . Smoking status: Never Smoker  . Smokeless tobacco: Never Used  Substance Use Topics  . Alcohol use: No    Alcohol/week: 0.0 standard drinks  . Drug use: No     Allergies   Patient has no known allergies.   Review of Systems Review of Systems  Constitutional: Negative for chills and fever.  HENT: Negative for ear pain and sore throat.   Eyes: Negative for pain and visual disturbance.  Respiratory: Negative for cough and shortness of breath.   Cardiovascular: Negative for chest pain and palpitations.  Gastrointestinal: Negative for abdominal pain and vomiting.  Genitourinary: Positive for vaginal bleeding. Negative for dysuria and hematuria.  Musculoskeletal: Negative for arthralgias and back pain.  Skin:  Negative for color change and rash.  Neurological: Negative for seizures and syncope.  All other systems reviewed and are negative.    Physical Exam Updated Vital Signs BP 104/67 (BP Location: Right Arm)   Pulse 73   Temp 99.2 F (37.3 C) (Oral)   Resp 16   Ht 5\' 2"  (1.575 m)   Wt 63.5 kg   LMP 08/27/2019 Comment: Gravidia 1, Para 0, AB 0  SpO2 100%   BMI 25.61 kg/m   Physical Exam Vitals signs and nursing note reviewed.  Constitutional:      General: She is not in acute distress.    Appearance: She is well-developed.  HENT:     Head: Normocephalic and atraumatic.  Eyes:     Conjunctiva/sclera: Conjunctivae normal.  Neck:     Musculoskeletal: Neck supple.  Cardiovascular:     Rate and Rhythm: Normal rate and regular rhythm.     Heart sounds: No murmur.  Pulmonary:     Effort: Pulmonary effort is normal. No respiratory distress.     Breath sounds: Normal breath sounds.  Abdominal:     Palpations: Abdomen is soft.     Tenderness: There is no abdominal tenderness.  Genitourinary:    Comments: Closed cervical os, no active bleeding, small white vaginal discharge noted Musculoskeletal:        General: No swelling or tenderness.  Skin:    General: Skin is warm and dry.     Capillary Refill: Capillary refill takes less than 2 seconds.  Neurological:     Mental Status: She is alert.    RN chaperoned  ED Treatments / Results  Labs (all labs ordered are listed, but only abnormal results are displayed) Labs Reviewed  HCG, QUANTITATIVE, PREGNANCY - Abnormal; Notable for the following components:      Result Value   hCG, Beta Chain, Quant, S 42,404 (*)    All other components within normal limits  CBC - Abnormal; Notable for the following components:   RBC 3.66 (*)    Hemoglobin 11.7 (*)    HCT 35.0 (*)    All other components within normal limits  BASIC METABOLIC PANEL - Abnormal; Notable for the following components:   Sodium 132 (*)    CO2 20 (*)    Calcium  8.7 (*)    All other components within normal limits  URINALYSIS, ROUTINE W REFLEX MICROSCOPIC - Abnormal; Notable for the following components:   Hgb urine dipstick SMALL (*)    Ketones, ur 40 (*)    All other components within  normal limits  URINALYSIS, MICROSCOPIC (REFLEX) - Abnormal; Notable for the following components:   Bacteria, UA MANY (*)    Trichomonas, UA PRESENT (*)    All other components within normal limits  ABO/RH    EKG None  Radiology No results found.  Procedures Ultrasound ED OB Pelvic  Date/Time: 10/06/2019 8:14 PM Performed by: Milagros Lollykstra, Hutch Rhett S, MD Authorized by: Milagros Lollykstra, [place] Huntsman S, MD   Procedure details:    Indications: pregnant with vaginal bleeding     Assess:  Fetal viability and intrauterine pregnancy   Technique:  Transabdominal obstetric (HCG+) exam   Images: archived    Uterine findings:    Intrauterine pregnancy: identified     Single gestation: identified     Gestational sac: identified     Fetal pole: identified     Fetal heart rate: not identified      Left ovary findings:    Left ovary:  Not visualized   Adnexal mass: not identified   Cysts: not identified   Right ovary findings:     Right ovary:  Not visualized   Adnexal mass: not identified   Cysts: not identified   Other findings:    Free pelvic fluid: not identified     Free peritoneal fluid: not identified   Comments:     Identified single gestational sac in uterus with fetal pole, unable to discern fetal heart tones, no adnexal masses appreciated   (including critical care time)  Medications Ordered in ED Medications - No data to display   Initial Impression / Assessment and Plan / ED Course  I have reviewed the triage vital signs and the nursing notes.  Pertinent labs & imaging results that were available during my care of the patient were reviewed by me and considered in my medical decision making (see chart for details).        23 year old female G1,  P0 at 7 weeks presented to ER with small vaginal bleeding.  Otherwise well-appearing, no associated symptoms.  Bedside ultrasound confirmed IUP, very small fetal pole, unable to clearly discern fetal heart tones.  Rh+.  No urinary symptoms, but urine with bacteria, will treat asymptomatic bacteriuria in pregnancy.  UA also noted trichomonas.  Will treat with course of cephalexin and single dose of Flagyl.  Recommend patient return for formal transvaginal ultrasound that may be able to better demonstrate fetal heart tones.  Regardless of US findings tomorrow, have recommended she contact her OB and schedule close follow up with them.     After the discussed management above, the patient was determined to be safe for discharge.  The patient was in agreement with this plan and all questions regarding their care were answered.  ED return precautions were discussed and the patient will return to the ED with any significant worsening of condition.    Final Clinical Impressions(s) / ED Diagnoses   Final diagnoses:  Threatened miscarriage in early pregnancy  Trichomoniasis  Asymptomatic bacteriuria during pregnancy in first trimester    ED Discharge Orders         Ordered    US OP OB Comp Less 14 Wks     10/06/19 1756    metroNIDAZOLE (FLAGYL) 500 MG tablet   Once     10/06/19 1756    cephALEXin (KEFLEX) 500 MG capsule  3 times daily     10/06/19 1756           Milagros Lollykstra, Analleli Gierke S, MD 10/06/19 2019

## 2019-10-06 NOTE — Discharge Instructions (Signed)
Please take the antibiotics as prescribed.  Please notify your sexual partner of the trichomoniasis and recommend they receive treatment testing.  He should refrain from further sexual activity until they have completed this.  Please return tomorrow for the ultrasound.  If you develop worsening pain, bleeding, any episodes of passing out or other new concerning symptom, please return to ER for reassessment.  Additionally, recommend that you call your OB/GYN tomorrow for close follow-up in their clinic.

## 2019-10-07 ENCOUNTER — Ambulatory Visit (HOSPITAL_BASED_OUTPATIENT_CLINIC_OR_DEPARTMENT_OTHER): Admission: RE | Admit: 2019-10-07 | Payer: Medicaid Other | Source: Ambulatory Visit

## 2019-10-07 ENCOUNTER — Other Ambulatory Visit (HOSPITAL_BASED_OUTPATIENT_CLINIC_OR_DEPARTMENT_OTHER): Payer: Self-pay | Admitting: Emergency Medicine

## 2019-10-07 ENCOUNTER — Encounter (HOSPITAL_BASED_OUTPATIENT_CLINIC_OR_DEPARTMENT_OTHER): Payer: Self-pay

## 2019-10-07 ENCOUNTER — Ambulatory Visit (HOSPITAL_BASED_OUTPATIENT_CLINIC_OR_DEPARTMENT_OTHER)
Admission: RE | Admit: 2019-10-07 | Discharge: 2019-10-07 | Disposition: A | Discharge status: Home / Self Care | Payer: Medicaid Other | Source: Ambulatory Visit | Attending: Emergency Medicine | Admitting: Emergency Medicine

## 2019-10-07 DIAGNOSIS — O209 Hemorrhage in early pregnancy, unspecified: Secondary | ICD-10-CM

## 2019-10-07 NOTE — ED Provider Notes (Signed)
1:37 PM Patient presents today for ultrasound that was ordered at ED visit yesterday.  I was asked to give patient her results. I reviewed ED chart. Patient had some minor vaginal bleeding in early pregnancy.  Ultrasound today demonstrates a small subchorionic hemorrhage, otherwise normal-appearing pregnancy at 6 weeks and 2 days.  I informed the patient of these results.  Informed that it could increase her risk for spontaneous abortion however no treatment specifically for this.  Encouraged that she follow-up with her doctor in regards to OB/GYN follow-up. She denies any additional bleeding since her visit.   We discussed signs and symptoms that should cause her to return to the emergency department including worsening pain in the lower abdomen, pelvis, or back, increasing bleeding.  Patient is in no acute distress.     Carlisle Cater, PA-C 10/07/19 [place], [place], DO 10/07/19 1519

## 2019-10-31 ENCOUNTER — Ambulatory Visit (INDEPENDENT_AMBULATORY_CARE_PROVIDER_SITE_OTHER): Payer: Medicaid Other | Admitting: *Deleted

## 2019-10-31 ENCOUNTER — Other Ambulatory Visit: Payer: Self-pay

## 2019-10-31 DIAGNOSIS — O0990 Supervision of high risk pregnancy, unspecified, unspecified trimester: Secondary | ICD-10-CM | POA: Insufficient documentation

## 2019-10-31 DIAGNOSIS — J45909 Unspecified asthma, uncomplicated: Secondary | ICD-10-CM | POA: Insufficient documentation

## 2019-10-31 DIAGNOSIS — A6009 Herpesviral infection of other urogenital tract: Secondary | ICD-10-CM

## 2019-10-31 DIAGNOSIS — Z3400 Encounter for supervision of normal first pregnancy, unspecified trimester: Secondary | ICD-10-CM

## 2019-10-31 DIAGNOSIS — J4520 Mild intermittent asthma, uncomplicated: Secondary | ICD-10-CM

## 2019-10-31 MED ORDER — BLOOD PRESSURE KIT DEVI: 1.0000 | 0 refills | Status: DC | PRN

## 2019-10-31 NOTE — Patient Instructions (Signed)

## 2019-10-31 NOTE — Progress Notes (Signed)
I connected with  Jasmine Thornton on 10/31/19 at  2:30 PM EST by telephone and verified that I am speaking with the correct person using two identifiers.   I discussed the limitations, risks, security and privacy concerns of performing an evaluation and management service by telephone and the availability of in person appointments. I also discussed with the patient that there may be a patient responsible charge related to this service. The patient expressed understanding and agreed to proceed. Explained I am completing her New OB Intake today. We discussed Her EDD and that it is based on  sure LMP. She denies any bleeding since her ED Visit. She denies any covid symptoms except diarrhea last week,none since. I instructed her if she has any other covid symtoms such as fever. Cough, sore throat, etc to get covid tested- I gave her the number to text or to go to website and she voices understanding.  I reviewed her allergies, meds, OB History, Medical /Surgical history, and appropriate screenings. I explained I will send her the [employer] app- app sent to her while on phone.  I explained we will send a blood pressure cuff to [facility] that will fill that prescription and they  will call her to verify her information. I asked her to bring the blood pressure cuff with her to her first ob appointment so we can show her how to use it. Explained  then we will have her take her blood pressure weekly and enter into the app. Explained she will have some visits in office and some virtually.I explained since she is low risk and only [redacted] weeks pregnant we will change her first visit to virtual and have a lab only appointment . I sent her MyChart text but she did not receive while we were on the phone. She will call office back if she does not get the text and am able to sign up. I Reviewed  Her new ob appointment date/ time with her , our location and to wear mask, no visitors. Explained she will have exam, ob bloodwork,  hemoglobin a1C, cbg , genetic testing if desired, pap if needed. I scheduled an [place] at 19 weeks and gave her the appointment. She voices understanding.  Ritta Hammes,RN 10/31/2019  2:29 PM

## 2019-11-08 ENCOUNTER — Ambulatory Visit (INDEPENDENT_AMBULATORY_CARE_PROVIDER_SITE_OTHER): Payer: Medicaid Other | Admitting: Nurse Practitioner

## 2019-11-08 ENCOUNTER — Other Ambulatory Visit: Payer: Self-pay

## 2019-11-08 ENCOUNTER — Other Ambulatory Visit: Payer: Self-pay | Admitting: Nurse Practitioner

## 2019-11-08 ENCOUNTER — Other Ambulatory Visit (HOSPITAL_COMMUNITY)
Admission: RE | Admit: 2019-11-08 | Discharge: 2019-11-08 | Disposition: A | Discharge status: Home / Self Care | Payer: Medicaid Other | Source: Ambulatory Visit | Attending: Nurse Practitioner | Admitting: Nurse Practitioner

## 2019-11-08 ENCOUNTER — Encounter: Payer: Self-pay | Admitting: Nurse Practitioner

## 2019-11-08 VITALS — BP 114/76 | HR 97 | Wt 143.0 lb

## 2019-11-08 DIAGNOSIS — O219 Vomiting of pregnancy, unspecified: Secondary | ICD-10-CM | POA: Insufficient documentation

## 2019-11-08 DIAGNOSIS — Z3A10 10 weeks gestation of pregnancy: Secondary | ICD-10-CM

## 2019-11-08 DIAGNOSIS — Z3400 Encounter for supervision of normal first pregnancy, unspecified trimester: Secondary | ICD-10-CM

## 2019-11-08 DIAGNOSIS — A6009 Herpesviral infection of other urogenital tract: Secondary | ICD-10-CM | POA: Insufficient documentation

## 2019-11-08 DIAGNOSIS — O26891 Other specified pregnancy related conditions, first trimester: Secondary | ICD-10-CM

## 2019-11-08 DIAGNOSIS — O98319 Other infections with a predominantly sexual mode of transmission complicating pregnancy, unspecified trimester: Secondary | ICD-10-CM

## 2019-11-08 DIAGNOSIS — J4520 Mild intermittent asthma, uncomplicated: Secondary | ICD-10-CM

## 2019-11-08 MED ORDER — DOXYLAMINE-PYRIDOXINE 10-10 MG PO TBEC: DELAYED_RELEASE_TABLET | ORAL | 2 refills | Status: DC

## 2019-11-08 NOTE — Progress Notes (Signed)
Subjective:   Jasmine Thornton is a 23 y.o. G1P0 at 77w3dby LMP being seen today for her first obstetrical visit.  Her obstetrical history is significant for no problems identifed. Patient does intend to breast feed. Pregnancy history fully reviewed.  Nexplanon removed about one year ago.  Did not keep for full 3 years.  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  Does not know the type.  Has only had one outbreak.    Patient reports nausea and being tired.  HISTORY: OB History  Gravida Para Term Preterm AB Living  1 0 0 0 0 0  SAB TAB Ectopic Multiple Live Births  0 0 0 0 0    # Outcome Date GA Lbr Len/2nd Weight Sex Delivery Anes PTL Lv  1 Current            Past Medical History:  Diagnosis Date  . Acne   . ADHD (attention deficit hyperactivity disorder), combined type   . Allergic rhinitis   . Asthma   . Headache   . Herpes genitalis in women   . Trichomoniasis    Past Surgical History:  Procedure Laterality Date  . NO PAST SURGERIES     Family History  Problem Relation Age of Onset  . Allergies Mother   . Anemia Mother   . Heart disease Maternal Grandmother    Social History   Tobacco Use  . Smoking status: Never Smoker  . Smokeless tobacco: Never Used  Substance Use Topics  . Alcohol use: Not Currently    Alcohol/week: 0.0 standard drinks    Comment: occasionally  . Drug use: No   No Known Allergies Current Outpatient Medications on File Prior to Visit  Medication Sig Dispense Refill  . albuterol (PROVENTIL HFA;VENTOLIN HFA) 108 (90 BASE) MCG/ACT inhaler Inhale into the lungs every 6 (six) hours as needed for wheezing or shortness of breath.    . Blood Pressure Monitoring (BLOOD PRESSURE KIT) DEVI 1 Device by Does not apply route as needed. 1 each 0  . Prenatal MV-Min-FA-Omega-3 (PRENATAL GUMMIES/DHA & FA PO) Take 2 tablets by mouth daily.     No current facility-administered medications on file prior to visit.     Exam   Vitals:   11/08/19 1333  BP: 114/76  Pulse: 97  Weight: 143 lb (64.9 kg)   Fetal Heart Rate (bpm): 170  Uterus:  Fundal Height: 10 cm  Pelvic Exam: Perineum: no hemorrhoids, normal perineum   Vulva: normal external genitalia, no lesions   Vagina:  normal mucosa, normal discharge   Cervix: no lesions and normal, pap smear done.    Adnexa: normal adnexa and no mass, fullness, tenderness   Bony Pelvis: average  System: General: well-developed, well-nourished female in no acute distress   Breast:  normal appearance, no masses or tenderness   Skin: normal coloration and turgor, no rashes   Neurologic: oriented, normal, negative, normal mood   Extremities: normal strength, tone, and muscle mass, ROM of all joints is normal   HEENT extraocular movement intact and sclera clear, anicteric   Mouth/Teeth mucous membranes moist, pharynx normal without lesions and dental hygiene good   Neck supple and no masses, normal thyroid   Cardiovascular: regular rate and rhythm   Respiratory:  no respiratory distress, normal breath sounds   Abdomen: soft, non-tender; no masses,  no organomegaly     Assessment:   Pregnancy: G1P0 Patient Active Problem List   Diagnosis Date  Noted  . Nausea and vomiting in pregnancy prior to [redacted] weeks gestation 11/08/2019  . Genital herpes affecting pregnancy, antepartum 11/08/2019  . Supervision of low-risk first pregnancy, unspecified trimester 10/31/2019  . Herpes genitalis in women 10/31/2019  . Asthma      Plan:  1. Supervision of low-risk first pregnancy, unspecified trimester Advised she will need online childbirth and breastfeeding classes later in the pregnancy Has prenatal vitamins  - Culture, OB Urine - Genetic Screening - Obstetric Panel, Including HIV - Hemoglobin A1c - Cytology - PAP( )  2. Mild intermittent asthma without complication She has a rescue inhaler but has not used it in about one year  3.  Nausea in pregnancy Prescribed Diclegis  and reviewed instructions for taking the medication.   Initial labs drawn. Continue prenatal vitamins. Genetic Screening discussed, NIPS: ordered. Ultrasound discussed; fetal anatomic survey: ordered. Problem list reviewed and updated. The nature of [facility] with multiple MDs and other Advanced Practice Providers was explained to patient; also emphasized that residents, students are part of our team. Routine obstetric precautions reviewed. Return in about 4 weeks (around 12/06/2019) for Virtual visit [place].  Total face-to-face time with patient: 40 minutes.  Over 50% of encounter was spent on counseling and coordination of care.     Earlie Server, FNP Family Nurse Practitioner, [facility] for [employer], [facility] 11/08/2019 9:52 PM

## 2019-11-08 NOTE — Patient Instructions (Signed)

## 2019-11-10 LAB — CYTOLOGY - PAP
Chlamydia: NEGATIVE
Comment: NEGATIVE
Comment: NORMAL
Diagnosis: NEGATIVE
Neisseria Gonorrhea: NEGATIVE

## 2019-11-10 LAB — OBSTETRIC PANEL, INCLUDING HIV
Antibody Screen: NEGATIVE
Basophils Absolute: 0.1 10*3/uL (ref 0.0–0.2)
Basos: 1 %
EOS (ABSOLUTE): 0.2 10*3/uL (ref 0.0–0.4)
Eos: 2 %
HIV Screen 4th Generation wRfx: NONREACTIVE
Hematocrit: 34.6 % (ref 34.0–46.6)
Hemoglobin: 11.7 g/dL (ref 11.1–15.9)
Hepatitis B Surface Ag: NEGATIVE
Immature Grans (Abs): 0 10*3/uL (ref 0.0–0.1)
Immature Granulocytes: 0 %
Lymphocytes Absolute: 2.3 10*3/uL (ref 0.7–3.1)
Lymphs: 23 %
MCH: 32.3 pg (ref 26.6–33.0)
MCHC: 33.8 g/dL (ref 31.5–35.7)
MCV: 96 fL (ref 79–97)
Monocytes Absolute: 0.8 10*3/uL (ref 0.1–0.9)
Monocytes: 8 %
Neutrophils Absolute: 6.5 10*3/uL (ref 1.4–7.0)
Neutrophils: 66 %
Platelets: 248 10*3/uL (ref 150–450)
RBC: 3.62 x10E6/uL — ABNORMAL LOW (ref 3.77–5.28)
RDW: 12 % (ref 11.7–15.4)
RPR Ser Ql: NONREACTIVE
Rh Factor: POSITIVE
Rubella Antibodies, IGG: 2.15 index (ref >0.99)
WBC: 9.9 10*3/uL (ref 3.4–10.8)

## 2019-11-10 LAB — HEMOGLOBIN A1C
Est. average glucose Bld gHb Est-mCnc: 91 mg/dL
Hgb A1c MFr Bld: 4.8 % (ref 4.8–5.6)

## 2019-11-11 LAB — URINE CULTURE, OB REFLEX

## 2019-11-11 LAB — CULTURE, OB URINE

## 2019-11-11 NOTE — L&D Delivery Note (Signed)
OB/GYN Faculty Practice Delivery Note  Jasmine Thornton is a 24 y.o. G1P0 s/p vaginal delivery at [redacted]w[redacted]d. She was admitted for IOL 2/2 cholestasis.   ROM: 2h 58m with clear fluid GBS Status: negative Maximum Maternal Temperature: 99.5*F  Labor Progress: . Induction was started with FB and cytotec. She progressed well, eventually needing minimal augmentation with pitocin. She SROMed and progressed to complete, delivering after a short second stage.  Delivery Date/Time: 05/09/20, 1455 Delivery: Called to room and patient was complete and pushing. Head delivered straight OA. No nuchal cord present. Shoulder and body delivered in usual fashion. Infant with spontaneous cry, placed on mother's abdomen, dried and stimulated. Cord clamped x 2 after 1-minute delay, and cut by grandmother of the baby under my direct supervision. Cord blood drawn. Placenta delivered spontaneously with gentle cord traction. Fundus firm with massage and Pitocin. Labia, perineum, vagina, and cervix were inspected, and a second degree tear was noted- repaired with 3-0 Monocryl in the usual fashion. She also had bilateral peri-urethral abrasions which were hemostatic and did not require repair.  Post placental IUD was inserted after delivery, see separate procedure note for details   Placenta: 3 vessel cord, intact, to L&D Complications: None Lacerations: 2nd degree, bilateral labial abrasions EBL: 165 mL Analgesia: epidural  Postpartum Planning [x]  message to sent to schedule follow-up  [x]  vaccines UTD  Infant: female  APGARs 9,9  weight per medical record  , DO OB/GYN Fellow, Faculty Practice

## 2019-11-30 ENCOUNTER — Encounter: Payer: Self-pay | Admitting: *Deleted

## 2019-12-06 ENCOUNTER — Telehealth (INDEPENDENT_AMBULATORY_CARE_PROVIDER_SITE_OTHER): Payer: Medicaid Other | Admitting: Advanced Practice Midwife

## 2019-12-06 ENCOUNTER — Encounter: Payer: Self-pay | Admitting: Advanced Practice Midwife

## 2019-12-06 ENCOUNTER — Other Ambulatory Visit: Payer: Self-pay

## 2019-12-06 DIAGNOSIS — Z3400 Encounter for supervision of normal first pregnancy, unspecified trimester: Secondary | ICD-10-CM

## 2019-12-06 DIAGNOSIS — Z3A14 14 weeks gestation of pregnancy: Secondary | ICD-10-CM

## 2019-12-06 DIAGNOSIS — A6009 Herpesviral infection of other urogenital tract: Secondary | ICD-10-CM

## 2019-12-06 DIAGNOSIS — Z3402 Encounter for supervision of normal first pregnancy, second trimester: Secondary | ICD-10-CM

## 2019-12-06 MED ORDER — BUTALBITAL-APAP-CAFFEINE 50-325-40 MG PO TABS: 1.0000 | ORAL_TABLET | Freq: Four times a day (QID) | ORAL | 0 refills | Status: DC | PRN

## 2019-12-06 MED ORDER — MAGNESIUM 500 MG PO CAPS: 1.0000 | ORAL_CAPSULE | Freq: Two times a day (BID) | ORAL | 11 refills | Status: DC

## 2019-12-06 NOTE — Progress Notes (Signed)
I connected with  [name] on 12/06/19 at  9:15 AM EST by telephone and verified that I am speaking with the correct person using two identifiers.   I discussed the limitations, risks, security and privacy concerns of performing an evaluation and management service by telephone and the availability of in person appointments. I also discussed with the patient that there may be a patient responsible charge related to this service. The patient expressed understanding and agreed to proceed.  Janene Madeira Emon Lance, CMA 12/06/2019  9:20 AM

## 2019-12-06 NOTE — Progress Notes (Signed)
   TELEHEALTH VIRTUAL OBSTETRICS VISIT ENCOUNTER NOTE  I connected with Jasmine Thornton on 12/06/19 at  9:15 AM EST by telephone at home and verified that I am speaking with the correct person using two identifiers.   I discussed the limitations, risks, security and privacy concerns of performing an evaluation and management service by telephone and the availability of in person appointments. I also discussed with the patient that there may be a patient responsible charge related to this service. The patient expressed understanding and agreed to proceed.  Subjective:  Jasmine Thornton is a 24 y.o. G1P0 at [redacted]w[redacted]d being followed for ongoing prenatal care.  She is currently monitored for the following issues for this high-risk pregnancy and has Supervision of low-risk first pregnancy, unspecified trimester; Asthma; Herpes genitalis in women; Nausea and vomiting in pregnancy prior to [redacted] weeks gestation; and Genital herpes affecting pregnancy, antepartum on their problem list.  Patient reports no complaints. Reports fetal movement. Denies any contractions, bleeding or leaking of fluid.   The following portions of the patient's history were reviewed and updated as appropriate: allergies, current medications, past family history, past medical history, past social history, past surgical history and problem list.   Objective:   General:  Alert, oriented and cooperative.   Mental Status: Normal mood and affect perceived. Normal judgment and thought content.  Rest of physical exam deferred due to type of encounter  Assessment and Plan:  Pregnancy: G1P0 at [redacted]w[redacted]d 1. Supervision of low-risk first pregnancy, unspecified trimester - AFP, Serum, Open Spina Bifida; Future - Patient reports frequent headaches. RX for fioricet and magnesium supplements given.   2. Herpes genitalis in women - plan for prophylaxis at 36 weeks   Preterm labor symptoms and general obstetric precautions including but not limited to  vaginal bleeding, contractions, leaking of fluid and fetal movement were reviewed in detail with the patient.  I discussed the assessment and treatment plan with the patient. The patient was provided an opportunity to ask questions and all were answered. The patient agreed with the plan and demonstrated an understanding of the instructions. The patient was advised to call back or seek an in-person office evaluation/go to [facility] at [facility] for any urgent or concerning symptoms. Please refer to After Visit Summary for other counseling recommendations.   I provided 10 minutes of non-face-to-face time during this encounter.  Return in about 4 weeks (around 01/03/2020) for virtual visit .  Future Appointments  Date Time Provider Department Center  01/10/2020  8:45 AM WH-[facility] [place] 2 WH-M[facility] M[facility]-US    Thressa Sheller DNP, CNM  12/06/19  9:28 AM  Center for [employer], [facility]

## 2020-01-03 ENCOUNTER — Encounter: Payer: Self-pay | Admitting: Medical

## 2020-01-03 ENCOUNTER — Telehealth (INDEPENDENT_AMBULATORY_CARE_PROVIDER_SITE_OTHER): Payer: Medicaid Other | Admitting: Medical

## 2020-01-03 VITALS — BP 104/76 | HR 80

## 2020-01-03 DIAGNOSIS — J4520 Mild intermittent asthma, uncomplicated: Secondary | ICD-10-CM | POA: Diagnosis not present

## 2020-01-03 DIAGNOSIS — O98312 Other infections with a predominantly sexual mode of transmission complicating pregnancy, second trimester: Secondary | ICD-10-CM

## 2020-01-03 DIAGNOSIS — Z3A18 18 weeks gestation of pregnancy: Secondary | ICD-10-CM | POA: Diagnosis not present

## 2020-01-03 DIAGNOSIS — A6009 Herpesviral infection of other urogenital tract: Secondary | ICD-10-CM

## 2020-01-03 DIAGNOSIS — Z3400 Encounter for supervision of normal first pregnancy, unspecified trimester: Secondary | ICD-10-CM

## 2020-01-03 DIAGNOSIS — O98319 Other infections with a predominantly sexual mode of transmission complicating pregnancy, unspecified trimester: Secondary | ICD-10-CM

## 2020-01-03 NOTE — Progress Notes (Signed)
I connected with Jasmine Thornton on 01/03/20 at 10:15 AM EST by: MyChart and verified that I am speaking with the correct person using two identifiers.  Patient is located at home and provider is located at [facility].     The purpose of this virtual visit is to provide medical care while limiting exposure to the novel coronavirus. I discussed the limitations, risks, security and privacy concerns of performing an evaluation and management service by MyChart and the availability of in person appointments. I also discussed with the patient that there may be a patient responsible charge related to this service. By engaging in this virtual visit, you consent to the provision of healthcare.  Additionally, you authorize for your insurance to be billed for the services provided during this visit.  The patient expressed understanding and agreed to proceed.  The following staff members participated in the virtual visit:  Virgina Evener, CMA    PRENATAL VISIT NOTE  Subjective:  Jasmine Thornton is a 24 y.o. G1P0 at [redacted]w[redacted]d  for phone visit for ongoing prenatal care.  She is currently monitored for the following issues for this low-risk pregnancy and has Supervision of low-risk first pregnancy, unspecified trimester; Asthma; Herpes genitalis in women; Nausea and vomiting in pregnancy prior to [redacted] weeks gestation; and Genital herpes affecting pregnancy, antepartum on their problem list.  Patient reports no complaints.  Contractions: Not present. Vag. Bleeding: None.  Movement: Present. Denies leaking of fluid.   The following portions of the patient's history were reviewed and updated as appropriate: allergies, current medications, past family history, past medical history, past social history, past surgical history and problem list.   Objective:   Vitals:   01/03/20 1001  BP: 104/76  Pulse: 80   Self-Obtained  Fetal Status:     Movement: Present     Assessment and Plan:  Pregnancy: G1P0 at [redacted]w[redacted]d 1. Supervision  of low-risk first pregnancy, unspecified trimester - Doing well - N/V has improved  - MOC still undecided, considering IUD  - Peds list given and importance of choosing Peds by 36 weeks discussed   2. Genital herpes affecting pregnancy, antepartum  3. Mild intermittent asthma without complication - No need for inhaler recently   Preterm labor symptoms and general obstetric precautions including but not limited to vaginal bleeding, contractions, leaking of fluid and fetal movement were reviewed in detail with the patient.  Return in about 4 weeks (around 01/31/2020) for LOB, Virtual.  Future Appointments  Date Time Provider Department Center  01/10/2020 10:30 AM [facility] LAB [facility] WOC  01/12/2020  2:45 PM WH-[facility] [place] 2 WH-MFCUS M[facility]-US     Time spent on virtual visit: 6 minutes  Vonzella Nipple, PA-C

## 2020-01-03 NOTE — Progress Notes (Signed)
I connected with  Jasmine Thornton on 01/03/20 at 10:15 AM EST by telephone and verified that I am speaking with the correct person using two identifiers.   I discussed the limitations, risks, security and privacy concerns of performing an evaluation and management service by telephone and the availability of in person appointments. I also discussed with the patient that there may be a patient responsible charge related to this service. The patient expressed understanding and agreed to proceed.  [name], CMA 01/03/2020  10:02 AM

## 2020-01-03 NOTE — Patient Instructions (Addendum)
Second Trimester of Pregnancy  The second trimester is from week 14 through week 27 (month 4 through 6). This is often the time in pregnancy that you feel your best. Often times, morning sickness has lessened or quit. You may have more energy, and you may get hungry more often. Your unborn baby is growing rapidly. At the end of the sixth month, he or she is about 9 inches long and weighs about 1 pounds. You will likely feel the baby move between 18 and 20 weeks of pregnancy. Follow these instructions at home: Medicines  Take over-the-counter and prescription medicines only as told by your doctor. Some medicines are safe and some medicines are not safe during pregnancy.  Take a prenatal vitamin that contains at least 600 micrograms (mcg) of folic acid.  If you have trouble pooping (constipation), take medicine that will make your stool soft (stool softener) if your doctor approves. Eating and drinking   Eat regular, healthy meals.  Avoid raw meat and uncooked cheese.  If you get low calcium from the food you eat, talk to your doctor about taking a daily calcium supplement.  Avoid foods that are high in fat and sugars, such as fried and sweet foods.  If you feel sick to your stomach (nauseous) or throw up (vomit): ? Eat 4 or 5 small meals a day instead of 3 large meals. ? Try eating a few soda crackers. ? Drink liquids between meals instead of during meals.  To prevent constipation: ? Eat foods that are high in fiber, like fresh fruits and vegetables, whole grains, and beans. ? Drink enough fluids to keep your pee (urine) clear or pale yellow. Activity  Exercise only as told by your doctor. Stop exercising if you start to have cramps.  Do not exercise if it is too hot, too humid, or if you are in a place of great height (high altitude).  Avoid heavy lifting.  Wear low-heeled shoes. Sit and stand up straight.  You can continue to have sex unless your doctor tells you not  to. Relieving pain and discomfort  Wear a good support bra if your breasts are tender.  Take warm water baths (sitz baths) to soothe pain or discomfort caused by hemorrhoids. Use hemorrhoid cream if your doctor approves.  Rest with your legs raised if you have leg cramps or low back pain.  If you develop puffy, bulging veins (varicose veins) in your legs: ? Wear support hose or compression stockings as told by your doctor. ? Raise (elevate) your feet for 15 minutes, 3-4 times a day. ? Limit salt in your food. Prenatal care  Write down your questions. Take them to your prenatal visits.  Keep all your prenatal visits as told by your doctor. This is important. Safety  Wear your seat belt when driving.  Make a list of emergency phone numbers, including numbers for family, friends, the hospital, and police and fire departments. General instructions  Ask your doctor about the right foods to eat or for help finding a counselor, if you need these services.  Ask your doctor about local prenatal classes. Begin classes before month 6 of your pregnancy.  Do not use hot tubs, steam rooms, or saunas.  Do not douche or use tampons or scented sanitary pads.  Do not cross your legs for long periods of time.  Visit your dentist if you have not done so. Use a soft toothbrush to brush your teeth. Floss gently.  Avoid all smoking, herbs,   and alcohol. Avoid drugs that are not approved by your doctor.  Do not use any products that contain nicotine or tobacco, such as cigarettes and e-cigarettes. If you need help quitting, ask your doctor.  Avoid cat litter boxes and soil used by cats. These carry germs that can cause birth defects in the baby and can cause a loss of your baby (miscarriage) or stillbirth. Contact a doctor if:  You have mild cramps or pressure in your lower belly.  You have pain when you pee (urinate).  You have bad smelling fluid coming from your vagina.  You continue to  feel sick to your stomach (nauseous), throw up (vomit), or have watery poop (diarrhea).  You have a nagging pain in your belly area.  You feel dizzy. Get help right away if:  You have a fever.  You are leaking fluid from your vagina.  You have spotting or bleeding from your vagina.  You have severe belly cramping or pain.  You lose or gain weight rapidly.  You have trouble catching your breath and have chest pain.  You notice sudden or extreme puffiness (swelling) of your face, hands, ankles, feet, or legs.  You have not felt the baby move in over an hour.  You have severe headaches that do not go away when you take medicine.  You have trouble seeing. Summary  The second trimester is from week 14 through week 27 (months 4 through 6). This is often the time in pregnancy that you feel your best.  To take care of yourself and your unborn baby, you will need to eat healthy meals, take medicines only if your doctor tells you to do so, and do activities that are safe for you and your baby.  Call your doctor if you get sick or if you notice anything unusual about your pregnancy. Also, call your doctor if you need help with the right food to eat, or if you want to know what activities are safe for you. This information is not intended to replace advice given to you by your health care provider. Make sure you discuss any questions you have with your health care provider. Document Revised: 02/18/2019 Document Reviewed: 12/02/2016 Elsevier Patient Education  2020 Elsevier Inc.  AREA PEDIATRIC/FAMILY PRACTICE PHYSICIANS  [place] ([address]) . [facility] o Chambliss, MD; Eniola, MD; Hale, MD; Hensel, MD; McDiarmid, MD; McIntyer, MD; Neal, MD; Walden, MD o [address] [place], [place] [address] o [phone] o Mon-Fri 8:30-12:30, 1:30-5:00 o Providers come to see babies at [facility] o Accepting Medicaid . [facility] o Limited providers who accept newborns: Koirala, MD; Morrow, MD; Wolters, MD o [address], [place], [place] [address] o [phone] o Mon-Fri 8:00-5:30 o Babies seen by providers at [facility] o Does NOT accept Medicaid o Please call early in hospitalization for appointment (limited availability)  . [facility] o Mulberry, MD o [address] [place], [place] [address] o [phone] o Mon, Tue, Thur, Fri 8:30-5:00, Wed 10:00-7:00 (closed 1-2pm) o Babies seen by [facility] providers o Accepting Medicaid . [facility] o Rubin, MD o [address], [place], [place] [address] o [phone] o Mon-Fri 8:30-5:00, Sat 8:30-12:00 o Provider comes to see babies at [facility] o Accepting Medicaid o Must have been referred from current patients or contacted office prior to delivery . [facility] (  [facility]) o Brown, MD; Chandler, MD; Ettefagh, MD; Grant, MD; Lester, MD; McCormick, MD; McQueen, MD; Prose, MD; Simha, MD; Stanley, MD; Stryffeler, NP; Tebben, NP o [address], [place], [place] [address] o [phone] o Mon, Tue, Thur, Fri 8:30-5:30, Wed 9:30-5:30, Sat 8:30-12:30 o Babies seen by [facility] providers o Accepting Medicaid o Only accepting infants of first-time parents or siblings of current patients o Hospital discharge coordinator will make follow-up appointment . Jack Amos o [address], [place], [place]  [address] o [phone]   Fax - [phone] . [facility] o [address], [place], [place]  [address] o Phone - [phone]   Fax - [phone] . Shilpa Gosrani o [address], [place], [place]  [address] o [phone]  [place] ([address]) . [facility] o Bates, MD; [place], MD; Cooper, Cox, MD; MD; Davis, MD; Dovico, MD;  Ettefaugh, MD; Little, MD; Lowe, MD; Keiffer, MD; Melvin, MD; Sumner, MD; Williams, MD o [address], [place], [place] [address] o [phone] o Mon-Fri 8:30-5:00 (extended evenings Mon-Thur as needed), Sat-Sun 10:00-1:00 o Providers come to see babies at [facility] o Accepting Medicaid for families of first-time babies and families with all children in the household age 3 and under. Must register with office prior to making appointment (M-F only). . [facility] o Henson, NP; Knapp, MD; Lalonde, MD; Tysinger, PA o [address] [place], [place] [address] o [phone] o Mon-Fri 8:00-5:00 o Babies seen by providers at [facility] o Does NOT accept Medicaid/Commercial Insurance Only . [facility] ([facility])  o Artis, MD; Barnes, MD; Bratton, MD; Coccaro, MD; Lockett Gardner, MD; Kramer, MD; Marshall, MD; Netherton, MD; Poleto, MD; Skinner, MD o [address] [place], [place] [address] o [phone] o Mon-Fri 8:30-5:30, Sat (Oct.-Mar.) 9:00-1:00 o Babies seen by providers at [facility] o Accepting Medicaid  [place] ([address]) . [facility] o Reid, MD; Warner, MD o [address], [place], [place] [address] o [phone] o Mon-Fri 8:30-5:00, Sat 8:30-12:00 o Providers come to see babies at [facility] o Does NOT accept Medicaid . [facility] o Becker, PA; Hagler, MD; Scifres, PA; Sun, MD; Swayne, MD o [address], [place], [place] [address] o [phone] o Mon-Fri 8:00-5:00 o Babies seen by providers at [facility] o Does NOT accept Medicaid o Only accepting babies of parents who are patients o Please call early in hospitalization for appointment (limited availability) . [facility] o Clark, MD; Frye, MD; Kelleher, MD; Mack, NP; Miller, MD; O'Keller, MD; Patterson, NP; Pudlo, MD; Puzio, MD; Thomas, MD;  Tucker, MD; Twiselton, MD o [address], [place], [place] [address] o [phone] o Mon-Fri 8:00-5:00, Sat 9:00-12:00 o Providers come to see babies at [facility] o Does NOT accept Medicaid  [place] ([address]) . [facility] o Limited providers accepting new patients: Brake, NP; Wharton, PA o [address], [place], [place] [address] o [phone] o Mon-Fri 8:00-5:00 o Babies seen by providers at [facility] o Does NOT accept Medicaid o Only accepting babies of parents who are patients o Please call early in hospitalization for appointment (limited availability) . [facility] o Gay, MD; Quinlan, MD o [address] ,  [address] o [phone] (press 1 to schedule appointment) o Mon-Fri 8:00-5:00 o Providers come to   see babies at [facility] o Does NOT accept Medicaid . [facility] o Mazer, MD o [address] [place], [place] [address] o [phone] o Mon-Fri 8:30-5:00 (lunch 12:30-1:00), extended hours by appointment only Wed 5:00-6:30 o Babies seen by [facility] providers o Accepting Medicaid . [facility] o Banks, MD; Jordan, MD; Koberlein, MD o [address], [place], [place] [address] o [phone] o Mon-Fri 8:00-5:00 o Babies seen by [facility] providers o Does NOT accept Medicaid . [facility] o Parker, MD; Hunter, MD; Wallace, DO o [address] [place], [place] [address] o [phone] o Mon-Fri 8:00-5:00 o Babies seen by [facility] providers o Does NOT accept Medicaid . [facility] o Brandon, PA; Brecken, PA; Christy, NP; Dees, MD; DeClaire, MD; DeWeese, MD; Hansen, NP; Mills, NP; Parrish, NP; Smoot, NP; Summer, MD; Vapne, MD o [address] [place], [place] [address] o [phone] o Mon-Fri 8:30-5:00, Sat 10:00-1:00 o Providers come to see babies at [facility] o Does NOT accept Medicaid o Free prenatal information session Tuesdays at 4:45pm . [facility] o Bouska, MD; Gordon, PA; Jeffery, PA; Weber, PA o [address] [place] [address] o [phone] o Mon-Fri 7:30-5:30 o Babies seen by [facility] providers . [facility] o [address], [place], [place]  [address] o [phone]   Fax - [phone]  [place] ([address] & [address]) . [facility] o [place], [place] o [address] [place], [place] [address] o [phone] o Mon-Thur 8:00-6:00 o Providers come to see babies at [facility] o Accepting Medicaid . [facility] o Anderson, NP; Badger, MD; Beal, PA; Spencer, PA o [address] [place], [place] [address] o [phone] o Mon-Thur 7:30-7:30, Fri 7:30-4:30 o Babies seen by [facility] providers o Accepting Medicaid . [facility] o Agbuya, MD; Klett, NP; Romgoolam, MD o [address], [place], [place] [address] o [phone] o Mon-Fri 8:30-5:00, Sat 8:30-12:00 o Providers come to see babies at [facility] o Accepting Medicaid o Must have "Meet & Greet" appointment at office prior to delivery . [facility] o McCord, MD; Wallace, MD; Wood, MD o [address], [place], [place] [address] o [phone] o Mon-Wed 8:00-6:00, Thur-Fri 8:00-5:00, Sat 9:00-12:00 o Providers come to see babies at [facility] o Does NOT accept Medicaid o Only accepting siblings of current patients . [facility]  o [address], [place], [place]  [address] o [phone]   Fax - [phone] . [facility] o [address], , [place]  [address] o [phone]   Fax - [phone]  [place]  ([address] & [address]) .   [facility] o Cirigliano, DO; Matthews, DO o [address] , [place] [address] o [phone] o Mon-Fri 7:00-5:00 o Babies seen by [facility] providers o Does NOT accept Medicaid . [facility] o Briscoe, MD; Howley, PA; Moreira, PA o [address], [place],  [address] o [phone] o Mon-Fri 8:00-5:00 o Babies seen by [facility] providers o Accepting Medicaid . [facility] o Boyd, MD; Church, PA; Tropea, NP; Osborn, PA o [address], ,  [address] o [phone] o Mon-Fri 8:00-5:00 o Babies seen by providers at [facility] o   Accepting Medicaid  [place] ([address]) . [facility] [place] o Wendling, DO o [address] [place], [place] [address] o [phone] o Mon-Fri 8:00-5:00 o Babies seen by [facility] providers o Does NOT accept Medicaid o Limited availability, please call early in hospitalization to schedule follow-up . [facility] o Calderon, PA; Cummings, MD; Dillard, MD; Martin, PA; Olson, MD; VanDeven, PA o [address], [place], [place] [address] o [phone] o Mon-Fri 8:30-5:00, Sat 9:00-12:00 o Babies seen by providers at [facility] o Accepting Medicaid o Please register online then schedule online or call office o [email or web address] . [facility] o Hunter, NP; Kumar, MD; Martin Rogers, PA o [address], [place], [place] [address] o [phone] o Mon-Fri 8:00-5:00 o Babies seen by providers at [facility] o Accepting Medicaid . [facility] ([facility] o Boyes [place], MD; Kristi Fleenor, NP; West, MD o [address], [place], [place] [address] o [phone] o Mon-Fri 8:00-5:30, Sat&Sun by  appointment (phones open at 8:30) o Babies seen by [facility] providers o Accepting Medicaid o Must be a first-time baby or sibling of current patient . [facility] [place]  o [address], [place], [place]  [address] o [phone]   Fax - [phone]  [place] ([address]) . [facility] o Brown, PA; Cowen, PA; Rice, MD; Helton, PA; Spry, MD o [address] [place], [place] [address] o [phone] o Mon-Thur 8:00-7:00, Fri 8:00-5:00, Sat 8:00-12:00, Sun 9:00-12:00 o Babies seen by [facility] providers o Accepting Medicaid . [facility] o Coe-Goins, MD; Marshall, MD; Pierre-Louis, MD o [address] [place] [address], [place], [place] [address] o [phone] o Mon-Thur 8:00-5:00 o Babies seen by providers at [facility] o Accepting Medicaid . [facility] o Bratton, MD; Coe-Goins, MD; Hayes, MD; Lewis, MD; List, MD; Lott, MD; Marshall, MD; Moran, MD; O'Neal, MD; Pierre-Louis, MD; Pitonzo, MD; Scholer, MD; Spangle, MD o [address] [place], [place] [address] o [phone] o Mon-Fri 8:00-5:30, Sat (Oct.-Mar.) 9:00-1:00 o Babies seen by providers at [facility] o Accepting Medicaid o Must fill out new patient packet, available online at [email or web address] . [facility] ([facility] o Friddle, NP; Harris, NP; Kelly, NP; Logan, MD; Melvin, PA; Poth, MD; Ramadoss, MD; Stanton, NP o [address], [place], [place] [address] o [phone] o Mon-Thur 8:00-5:30, Fri 8:00-5:00 o Babies seen by providers at [facility] o Accepting Medicaid  [place] ([address]) . [facility] o Dixon, PA; Winnie, MD; Pickard, MD; Tapia, PA o [address], [place], [place] [address] o [phone] o Mon-Fri 8:00-5:00 o Babies seen  by providers at [facility] o Accepting Medicaid   [place] ([address]) . [facility] [place] o Masneri, DO; Meyers, MD; Nelson, PA o [address], [place], [place] [address] o [phone] o Mon-Fri 8:00-5:00 o Babies seen by providers at [facility] o Does NOT accept Medicaid o Limited appointment availability, please call early in hospitalization  . [facility] [place] o Kunedd, DO; McGowen, MD o [address], [place],  [address] o [phone] o   Mon-Fri 8:00-5:00 o Babies seen by [facility] providers o Does NOT accept Medicaid . [facility] [place] o Cameron, MD; MacDonald, MD; Michaels, PA; Nayak, MD o [address] [place] [address], [place], [place] [address] o [phone] o Mon-Fri 8:00-5:00 o After hours clinic ([address] , [place] [address]) [phone] Mon-Fri 5:00-8:00, Sat 12:00-6:00, Sun 10:00-4:00 o Babies seen by [facility] providers o Accepting Medicaid . [facility] [place] o [address], [place], [place]  [address] o [phone]   Fax - [phone]  [place] ([address]) . [facility] [place] o Andy, MD o [address], [place], [place] [address] o [phone] o Mon-Fri 8:00-5:00 o Babies seen by [facility] providers o Does NOT accept Medicaid . [facility] [place] ([facility] [place]) o Eksir, MD o [address], [place], [place] [address] o [phone] o Mon-Thur 8:00-7:00, Fri 8:00-5:00, Sat 8:00-12:00 o Babies seen by providers at [facility] o Accepting Medicaid - but does not have vaccinations in office (must be received elsewhere) o Limited availability, please call early in hospitalization  [place] ([address]) . [facility]  o Charlene Flemming, MD o [address], [place]  [address] o [phone]  Fax [phone]    

## 2020-01-10 ENCOUNTER — Ambulatory Visit (HOSPITAL_COMMUNITY): Payer: Medicaid Other

## 2020-01-10 ENCOUNTER — Other Ambulatory Visit: Payer: Medicaid Other

## 2020-01-11 ENCOUNTER — Encounter (HOSPITAL_BASED_OUTPATIENT_CLINIC_OR_DEPARTMENT_OTHER): Payer: Self-pay

## 2020-01-11 ENCOUNTER — Other Ambulatory Visit: Payer: Self-pay

## 2020-01-11 ENCOUNTER — Emergency Department (HOSPITAL_BASED_OUTPATIENT_CLINIC_OR_DEPARTMENT_OTHER)
Admission: EM | Admit: 2020-01-11 | Discharge: 2020-01-11 | Disposition: A | Discharge status: Home / Self Care | Payer: Medicaid Other | Attending: Emergency Medicine | Admitting: Emergency Medicine

## 2020-01-11 DIAGNOSIS — Z79899 Other long term (current) drug therapy: Secondary | ICD-10-CM | POA: Diagnosis not present

## 2020-01-11 DIAGNOSIS — J45909 Unspecified asthma, uncomplicated: Secondary | ICD-10-CM | POA: Insufficient documentation

## 2020-01-11 DIAGNOSIS — R1031 Right lower quadrant pain: Secondary | ICD-10-CM | POA: Insufficient documentation

## 2020-01-11 DIAGNOSIS — Z3A19 19 weeks gestation of pregnancy: Secondary | ICD-10-CM | POA: Insufficient documentation

## 2020-01-11 DIAGNOSIS — O2342 Unspecified infection of urinary tract in pregnancy, second trimester: Secondary | ICD-10-CM | POA: Insufficient documentation

## 2020-01-11 DIAGNOSIS — O99891 Other specified diseases and conditions complicating pregnancy: Secondary | ICD-10-CM | POA: Diagnosis present

## 2020-01-11 DIAGNOSIS — O26892 Other specified pregnancy related conditions, second trimester: Secondary | ICD-10-CM

## 2020-01-11 DIAGNOSIS — O99512 Diseases of the respiratory system complicating pregnancy, second trimester: Secondary | ICD-10-CM | POA: Diagnosis not present

## 2020-01-11 DIAGNOSIS — R109 Unspecified abdominal pain: Secondary | ICD-10-CM

## 2020-01-11 LAB — URINALYSIS, COMPLETE (UACMP) WITH MICROSCOPIC
Bilirubin Urine: NEGATIVE
Glucose, UA: NEGATIVE mg/dL
Hgb urine dipstick: NEGATIVE
Ketones, ur: 15 mg/dL — AB
Leukocytes,Ua: NEGATIVE
Nitrite: NEGATIVE
Protein, ur: NEGATIVE mg/dL
RBC / HPF: NONE SEEN RBC/hpf (ref 0–5)
Specific Gravity, Urine: 1.015 (ref 1.005–1.030)
pH: 7 (ref 5.0–8.0)

## 2020-01-11 LAB — COMPREHENSIVE METABOLIC PANEL
ALT: 30 U/L (ref 0–44)
AST: 29 U/L (ref 15–41)
Albumin: 3.4 g/dL — ABNORMAL LOW (ref 3.5–5.0)
Alkaline Phosphatase: 41 U/L (ref 38–126)
Anion gap: 7 (ref 5–15)
BUN: 8 mg/dL (ref 6–20)
CO2: 22 mmol/L (ref 22–32)
Calcium: 8.8 mg/dL — ABNORMAL LOW (ref 8.9–10.3)
Chloride: 105 mmol/L (ref 98–111)
Creatinine, Ser: 0.52 mg/dL (ref 0.44–1.00)
GFR calc Af Amer: >60 mL/min (ref >60)
GFR calc non Af Amer: >60 mL/min (ref >60)
Glucose, Bld: 74 mg/dL (ref 70–99)
Potassium: 3.8 mmol/L (ref 3.5–5.1)
Sodium: 134 mmol/L — ABNORMAL LOW (ref 135–145)
Total Bilirubin: 0.6 mg/dL (ref 0.3–1.2)
Total Protein: 7.1 g/dL (ref 6.5–8.1)

## 2020-01-11 LAB — URINALYSIS, ROUTINE W REFLEX MICROSCOPIC
Bilirubin Urine: NEGATIVE
Glucose, UA: NEGATIVE mg/dL
Hgb urine dipstick: NEGATIVE
Ketones, ur: 15 mg/dL — AB
Leukocytes,Ua: NEGATIVE
Nitrite: NEGATIVE
Protein, ur: NEGATIVE mg/dL
Specific Gravity, Urine: 1.02 (ref 1.005–1.030)
pH: 7.5 (ref 5.0–8.0)

## 2020-01-11 LAB — LIPASE, BLOOD: Lipase: 24 U/L (ref 11–51)

## 2020-01-11 LAB — CBC WITH DIFFERENTIAL/PLATELET
Abs Immature Granulocytes: 0.03 10*3/uL (ref 0.00–0.07)
Basophils Absolute: 0 10*3/uL (ref 0.0–0.1)
Basophils Relative: 0 %
Eosinophils Absolute: 0.3 10*3/uL (ref 0.0–0.5)
Eosinophils Relative: 3 %
HCT: 34.7 % — ABNORMAL LOW (ref 36.0–46.0)
Hemoglobin: 11.5 g/dL — ABNORMAL LOW (ref 12.0–15.0)
Immature Granulocytes: 0 %
Lymphocytes Relative: 18 %
Lymphs Abs: 1.6 10*3/uL (ref 0.7–4.0)
MCH: 32.7 pg (ref 26.0–34.0)
MCHC: 33.1 g/dL (ref 30.0–36.0)
MCV: 98.6 fL (ref 80.0–100.0)
Monocytes Absolute: 0.7 10*3/uL (ref 0.1–1.0)
Monocytes Relative: 8 %
Neutro Abs: 6.2 10*3/uL (ref 1.7–7.7)
Neutrophils Relative %: 71 %
Platelets: 213 10*3/uL (ref 150–400)
RBC: 3.52 MIL/uL — ABNORMAL LOW (ref 3.87–5.11)
RDW: 12.5 % (ref 11.5–15.5)
WBC: 8.8 10*3/uL (ref 4.0–10.5)
nRBC: 0 % (ref 0.0–0.2)

## 2020-01-11 MED ORDER — CEPHALEXIN 500 MG PO CAPS: 500.0000 mg | ORAL_CAPSULE | Freq: Three times a day (TID) | ORAL | 0 refills | Status: AC

## 2020-01-11 MED ORDER — CEPHALEXIN 250 MG PO CAPS
500.0000 mg | ORAL_CAPSULE | Freq: Once | ORAL | Status: AC
  Administered 2020-01-11: 500 mg via ORAL
  Filled 2020-01-11: qty 2

## 2020-01-11 NOTE — ED Notes (Signed)
ED Provider at bedside. 

## 2020-01-11 NOTE — Discharge Instructions (Signed)
Please take all of your antibiotics until finished!   Take your antibiotics with food.  Common side effects of antibiotics include nausea, vomiting, abdominal discomfort, and diarrhea. You may help offset some of this with probiotics which you can buy or get in yogurt. Do not eat  or take the probiotics until 2 hours after your antibiotic.    You can take 1 to 2 tablets of Tylenol every 6 hours as needed for pain.  Can also apply heating pad 20 minutes at a time 3 or 4 times daily as needed for pain.  Follow-up with your OB/GYN tomorrow as scheduled for reevaluation of your symptoms.  Return to the emergency department if any concerning signs or symptoms develop such as high fevers, persistent vomiting, severe worsening pain, or vaginal bleeding.

## 2020-01-11 NOTE — ED Provider Notes (Signed)
[facility] EMERGENCY DEPARTMENT Provider Note   CSN: [id number] Arrival date & time: 01/11/20  7628     History Chief Complaint  Patient presents with  . Abdominal Pain    Jasmine Thornton is a 24 y.o. G1P0 female at 75w4dgestation presents for evaluation of acute onset, constant right lower quadrant abdominal pain beginning upon awakening this morning at around 5:45 AM.  Reports that she went to bed in her usual state of health.  She awoke with sharp pains to the right lower quadrant which do not radiate.  It worsens with ambulation and standing but improves sitting and laying.  Denies fevers, nausea, vomiting, chest pain, or shortness of breath.  She did have mild dysuria this morning, no other urinary symptoms.  She took a tablet of Tylenol with improvement in her pain but then the pain worsened again which brought her to the ED for evaluation.  She denies any vaginal bleeding, itching, or discharge.  She has not been sexually active since she became pregnant.  She is scheduled to see an OB/GYN tomorrow.  The history is provided by the patient.       Past Medical History:  Diagnosis Date  . Acne   . ADHD (attention deficit hyperactivity disorder), combined type   . Allergic rhinitis   . Asthma   . Headache   . Herpes genitalis in women   . Trichomoniasis     Patient Active Problem List   Diagnosis Date Noted  . Nausea and vomiting in pregnancy prior to [redacted] weeks gestation 11/08/2019  . Genital herpes affecting pregnancy, antepartum 11/08/2019  . Supervision of low-risk first pregnancy, unspecified trimester 10/31/2019  . Herpes genitalis in women 10/31/2019  . Asthma     Past Surgical History:  Procedure Laterality Date  . NO PAST SURGERIES       OB History    Gravida  1   Para      Term      Preterm      AB      Living        SAB      TAB      Ectopic      Multiple      Live Births              Family History  Problem Relation Age  of Onset  . Allergies Mother   . Anemia Mother   . Heart disease Maternal Grandmother     Social History   Tobacco Use  . Smoking status: Never Smoker  . Smokeless tobacco: Never Used  Substance Use Topics  . Alcohol use: Not Currently    Alcohol/week: 0.0 standard drinks    Comment: occasionally  . Drug use: No    Home Medications Prior to Admission medications   Medication Sig Start Date End Date Taking? Authorizing Provider  albuterol (PROVENTIL HFA;VENTOLIN HFA) 108 (90 BASE) MCG/ACT inhaler Inhale into the lungs every 6 (six) hours as needed for wheezing or shortness of breath.    [provider]  Blood Pressure Monitoring (BLOOD PRESSURE KIT) DEVI 1 Device by Does not apply route as needed. 10/31/19   BVirginia Rochester NP  butalbital-acetaminophen-caffeine (FIORICET) [phone] tablet Take 1-2 tablets by mouth every 6 (six) hours as needed for headache. 12/06/19 12/05/20  HTresea Mall CNM  cephALEXin (KEFLEX) 500 MG capsule Take 1 capsule (500 mg total) by mouth 3 (three) times daily for 5 days. 01/11/20 01/16/20  Kee Drudge A, PA-C  Doxylamine-Pyridoxine (DICLEGIS) 10-10 MG TBEC Take 2 tablets at bedtime and one in the morning and one in the afternoon as needed for nausea. Patient not taking: Reported on 12/06/2019 11/08/19   Virginia Rochester, NP  Magnesium 500 MG CAPS Take 1 capsule (500 mg total) by mouth 2 (two) times daily. 12/06/19   Marcille Buffy D, CNM  Prenatal MV-Min-FA-Omega-3 (PRENATAL GUMMIES/DHA & FA PO) Take 2 tablets by mouth daily.    [provider]    Allergies    Patient has no known allergies.  Review of Systems   Review of Systems  Constitutional: Negative for chills and fever.  Respiratory: Negative for shortness of breath.   Cardiovascular: Negative for chest pain.  Gastrointestinal: Positive for abdominal pain. Negative for nausea and vomiting.  Genitourinary: Positive for dysuria. Negative for frequency, hematuria, urgency  and vaginal bleeding.  All other systems reviewed and are negative.   Physical Exam Updated Vital Signs BP 102/71 (BP Location: Left Arm)   Pulse 71   Temp 97.9 F (36.6 C) (Oral)   Resp 18   Ht _0  (1.575 m)   Wt 64.9 kg   LMP 08/27/2019 Comment: Gravidia 1, Para 0, AB 0  SpO2 100%   BMI 26.16 kg/m   Physical Exam Vitals and nursing note reviewed.  Constitutional:      General: She is not in acute distress.    Appearance: She is well-developed.  HENT:     Head: Normocephalic and atraumatic.  Eyes:     General:        Right eye: No discharge.        Left eye: No discharge.     Conjunctiva/sclera: Conjunctivae normal.  Neck:     Vascular: No JVD.     Trachea: No tracheal deviation.  Cardiovascular:     Rate and Rhythm: Normal rate and regular rhythm.  Pulmonary:     Effort: Pulmonary effort is normal.     Breath sounds: Normal breath sounds.  Abdominal:     General: Abdomen is protuberant. There is no distension.     Palpations: Abdomen is soft.     Tenderness: There is abdominal tenderness in the right lower quadrant, suprapubic area and left lower quadrant. There is no right CVA tenderness, left CVA tenderness, guarding or rebound. Negative signs include Murphy's sign, Rovsing's sign, McBurney's sign and psoas sign.     Comments: Abdomen is gravid, uterine fundus palpable just under the umbilicus.  Skin:    General: Skin is warm and dry.     Findings: No erythema.  Neurological:     Mental Status: She is alert.  Psychiatric:        Behavior: Behavior normal.     ED Results / Procedures / Treatments   Labs (all labs ordered are listed, but only abnormal results are displayed) Labs Reviewed  CBC WITH DIFFERENTIAL/PLATELET - Abnormal; Notable for the following components:      Result Value   RBC 3.52 (*)    Hemoglobin 11.5 (*)    HCT 34.7 (*)    All other components within normal limits  COMPREHENSIVE METABOLIC PANEL - Abnormal; Notable for the following  components:   Sodium 134 (*)    Calcium 8.8 (*)    Albumin 3.4 (*)    All other components within normal limits  URINALYSIS, ROUTINE W REFLEX MICROSCOPIC - Abnormal; Notable for the following components:   APPearance CLOUDY (*)    Ketones,  ur 15 (*)    All other components within normal limits  URINALYSIS, COMPLETE (UACMP) WITH MICROSCOPIC - Abnormal; Notable for the following components:   APPearance CLOUDY (*)    Ketones, ur 15 (*)    Bacteria, UA MANY (*)    All other components within normal limits  URINE CULTURE  LIPASE, BLOOD    EKG None  Radiology No results found.  Procedures Procedures (including critical care time)  Medications Ordered in ED Medications  cephALEXin (KEFLEX) capsule 500 mg (has no administration in time range)    ED Course  I have reviewed the triage vital signs and the nursing notes.  Pertinent labs & imaging results that were available during my care of the patient were reviewed by me and considered in my medical decision making (see chart for details).    MDM Rules/Calculators/[name]                      Pregnant female presenting for evaluation of lower abdominal pain with dysuria beginning today.  Improved with Tylenol.  She is afebrile, vital signs are stable.  She is nontoxic in appearance.  Abdomen is soft with no peritoneal signs.  Maximally tender to palpation along the suprapubic region.  Lab work reviewed by me shows no leukocytosis, mild anemia, no metabolic derangements, no renal insufficiency.  No vaginal itching, bleeding, or discharge and she has low suspicion of STDs.  Her UA shows many bacteria, will treat in the setting of pregnancy.  We will also culture her urine.  On reevaluation she is resting comfortably in no apparent distress.  Doubt acute surgical abdominal pathology at this time in the absence of fever, vomiting, leukocytosis, or concerning lab findings.  Doubt nephrolithiasis, obstruction, perforation, diverticulitis, or  cholecystitis.  We will start her on a course of Keflex.  She has an appointment to see OB/GYN tomorrow and I encouraged her to keep this appointment. Discussed strict ED return precautions.  Patient verbalized understanding of and agreement with plan and patient stable for discharge home at this time.  She was seen and evaluated by Dr. Maryan Rued who agrees with assessment and plan at this time as well.  Final Clinical Impression(s) / ED Diagnoses Final diagnoses:  Urinary tract infection in mother during second trimester of pregnancy  Abdominal pain during pregnancy in second trimester    Rx / DC Orders ED Discharge Orders         Ordered    cephALEXin (KEFLEX) 500 MG capsule  3 times daily     01/11/20 1154           Josiel Gahm, [place] A, PA-C 01/11/20 1200    Blanchie Dessert, MD 01/11/20 1513

## 2020-01-11 NOTE — ED Triage Notes (Addendum)
Pt c/o abdominal pain starting this morning. Pt reports that she is [redacted] weeks pregnant, has seen OBGYN once (pt clarifies that she has had virtual visits with OBGYN), unsure of Dr name. Due date July 21st.

## 2020-01-12 ENCOUNTER — Ambulatory Visit (HOSPITAL_COMMUNITY)
Admission: RE | Admit: 2020-01-12 | Discharge: 2020-01-12 | Disposition: A | Discharge status: Home / Self Care | Payer: Medicaid Other | Source: Ambulatory Visit | Attending: Nurse Practitioner | Admitting: Nurse Practitioner

## 2020-01-12 DIAGNOSIS — Z3A20 20 weeks gestation of pregnancy: Secondary | ICD-10-CM

## 2020-01-12 DIAGNOSIS — Z363 Encounter for antenatal screening for malformations: Secondary | ICD-10-CM | POA: Diagnosis not present

## 2020-01-12 DIAGNOSIS — Z3400 Encounter for supervision of normal first pregnancy, unspecified trimester: Secondary | ICD-10-CM | POA: Diagnosis present

## 2020-01-12 LAB — URINE CULTURE

## 2020-01-13 ENCOUNTER — Other Ambulatory Visit: Payer: Self-pay | Admitting: Advanced Practice Midwife

## 2020-01-13 ENCOUNTER — Encounter: Payer: Self-pay | Admitting: Family Medicine

## 2020-01-13 ENCOUNTER — Other Ambulatory Visit: Payer: Self-pay

## 2020-01-13 ENCOUNTER — Other Ambulatory Visit: Payer: Medicaid Other

## 2020-01-14 LAB — AFP, SERUM, OPEN SPINA BIFIDA
AFP MoM: 1.4
AFP Value: 93.4 ng/mL
Gest. Age on Collection Date: 20.3 weeks
Maternal Age At EDD: 22.1 yr
OSBR Risk 1 IN: 7201
Test Results:: NEGATIVE
Weight: 145 [lb_av]

## 2020-01-31 ENCOUNTER — Telehealth (INDEPENDENT_AMBULATORY_CARE_PROVIDER_SITE_OTHER): Payer: Medicaid Other | Admitting: Advanced Practice Midwife

## 2020-01-31 ENCOUNTER — Encounter: Payer: Self-pay | Admitting: Advanced Practice Midwife

## 2020-01-31 DIAGNOSIS — Z3400 Encounter for supervision of normal first pregnancy, unspecified trimester: Secondary | ICD-10-CM | POA: Diagnosis not present

## 2020-01-31 DIAGNOSIS — A6009 Herpesviral infection of other urogenital tract: Secondary | ICD-10-CM

## 2020-01-31 NOTE — Progress Notes (Signed)
   TELEHEALTH VIRTUAL OBSTETRICS VISIT ENCOUNTER NOTE  I connected with Jasmine Thornton on 01/31/20 at  3:15 PM EDT by telephone at home and verified that I am speaking with the correct person using two identifiers.   I discussed the limitations, risks, security and privacy concerns of performing an evaluation and management service by telephone and the availability of in person appointments. I also discussed with the patient that there may be a patient responsible charge related to this service. The patient expressed understanding and agreed to proceed.  Subjective:  Jasmine Thornton is a 24 y.o. G1P0 at [redacted]w[redacted]d being followed for ongoing prenatal care.  She is currently monitored for the following issues for this low-risk pregnancy and has Supervision of low-risk first pregnancy, unspecified trimester; Asthma; Herpes genitalis in women; Nausea and vomiting in pregnancy prior to [redacted] weeks gestation; and Genital herpes affecting pregnancy, antepartum on their problem list.  Patient reports no complaints. Reports fetal movement. Denies any contractions, bleeding or leaking of fluid.   The following portions of the patient's history were reviewed and updated as appropriate: allergies, current medications, past family history, past medical history, past social history, past surgical history and problem list.   Objective:   General:  Alert, oriented and cooperative.   Mental Status: Normal mood and affect perceived. Normal judgment and thought content.  Rest of physical exam deferred due to type of encounter  Assessment and Plan:  Pregnancy: G1P0 at [redacted]w[redacted]d 1. Supervision of low-risk first pregnancy, unspecified trimester - 28 week labs and GTT at next visit - Peds list, childbirth education, water birth and doula info given to patient.     Preterm labor symptoms and general obstetric precautions including but not limited to vaginal bleeding, contractions, leaking of fluid and fetal movement were  reviewed in detail with the patient.  I discussed the assessment and treatment plan with the patient. The patient was provided an opportunity to ask questions and all were answered. The patient agreed with the plan and demonstrated an understanding of the instructions. The patient was advised to call back or seek an in-person office evaluation/go to [facility] at [facility] for any urgent or concerning symptoms. Please refer to After Visit Summary for other counseling recommendations.   I provided 12 minutes of non-face-to-face time during this encounter.  Return in about 5 weeks (around 03/06/2020) for 28 week labs and GTT .  No future appointments.  Thressa Sheller DNP, CNM  01/31/20  3:38 PM  [facility] [employer], [facility]

## 2020-01-31 NOTE — Progress Notes (Signed)
I connected with  Jasmine Thornton on 01/31/20 at  3:15 PM EDT by telephone and verified that I am speaking with the correct person using two identifiers.   I discussed the limitations, risks, security and privacy concerns of performing an evaluation and management service by telephone and the availability of in person appointments. I also discussed with the patient that there may be a patient responsible charge related to this service. The patient expressed understanding and agreed to proceed.  Ernestina Patches, CMA 01/31/2020  3:18 PM

## 2020-01-31 NOTE — Patient Instructions (Addendum)
Childbirth Education Options: [facility] Department Classes:  Childbirth education classes can help you get ready for a positive parenting experience. You can also meet other expectant parents and get free stuff for your baby. Each class runs for five weeks on the same night and costs $45 for the mother-to-be and her support person. Medicaid covers the cost if you are eligible. Call [phone] to register. [facility] Childbirth Education:  [phone] or [phone] or [email or web address]  Baby & Me Class: Discuss newborn & infant parenting and family adjustment issues with other new mothers in a relaxed environment. Each week brings a new speaker or baby-centered activity. We encourage new mothers to join [place] every Thursday at 11:00am. Babies birth until crawling. No registration or fee. Daddy [employer]: This course offers Dads-to-be the tools and knowledge needed to feel confident on their journey to becoming new fathers. Experienced dads, who have been trained as coaches, teach dads-to-be how to hold, comfort, diaper, swaddle and play with their infant while being able to support the new mom as well. A class for men taught by men. $25/dad Big Brother/Big Sister: Let your children share in the joy of a new brother or sister in this special class designed just for them. Class includes discussion about how families care for babies: swaddling, holding, diapering, safety as well as how they can be helpful in their new role. This class is designed for children ages 45 to 48, but any age is welcome. Please register each child individually. $5/child  Mom Talk: This mom-led group offers support and connection to mothers as they journey through the adjustments and struggles of that sometimes overwhelming first year after the birth of a child. Tuesdays at 10:00am and Thursdays at 6:00pm. Babies welcome. No registration or fee. Breastfeeding Support Group: This group is a mother-to-mother  support circle where moms have the opportunity to share their breastfeeding experiences. A Lactation Consultant is present for questions and concerns. Meets each Tuesday at 11:00am. No fee or registration. Breastfeeding Your Baby: Learn what to expect in the first days of breastfeeding your newborn.  This class will help you feel more confident with the skills needed to begin your breastfeeding experience. Many new mothers are concerned about breastfeeding after leaving the hospital. This class will also address the most common fears and challenges about breastfeeding during the first few weeks, months and beyond. (call for fee) Comfort Techniques and Tour: This 2 hour interactive class will provide you the opportunity to learn & practice hands-on techniques that can help relieve some of the discomfort of labor and encourage your baby to rotate toward the best position for birth. You and your partner will be able to try a variety of labor positions with birth balls and rebozos as well as practice breathing, relaxation, and visualization techniques. A tour of the [facility] is included with this class. $20 per registrant and support person Childbirth Class- Weekend Option: This class is a Weekend version of our Birth & Baby series. It is designed for parents who have a difficult time fitting several weeks of classes into their schedule. It covers the care of your newborn and the basics of labor and childbirth. It also includes a [place] of [facility] and lunch. The class is held two consecutive days: beginning on Friday evening from 6:30 - 8:30 p.m. and the next day, Saturday from 9 a.m. - 4 p.m. (call for fee) Doren Custard Class: Interested in a waterbirth?  This  informational class will help you discover whether waterbirth is the right fit for you. Education about waterbirth itself, supplies you would need and how to assemble your support team is what you can  expect from this class. Some obstetrical practices require this class in order to pursue a waterbirth. (Not all obstetrical practices offer waterbirth-check with your healthcare provider.) Register only the expectant mom, but you are encouraged to bring your partner to class! Required if planning waterbirth, no fee. Infant/Child CPR: Parents, grandparents, babysitters, and friends learn Cardio-Pulmonary Resuscitation skills for infants and children. You will also learn how to treat both conscious and unconscious choking in infants and children. This Family & Friends program does not offer certification. Register each participant individually to ensure that enough mannequins are available. (Call for fee) Grandparent Love: Expecting a grandbaby? This class is for you! Learn about the latest infant care and safety recommendations and ways to support your own child as he or she transitions into the parenting role. Taught by Registered Nurses who are childbirth instructors, but most importantly...they are grandmothers too! $10/person. Childbirth Class- Natural Childbirth: This series of 5 weekly classes is for expectant parents who want to learn and practice natural methods of coping with the process of labor and childbirth. Relaxation, breathing, massage, visualization, role of the partner, and helpful positioning are highlighted. Participants learn how to be confident in their body's ability to give birth. This class will empower and help parents make informed decisions about their own care. Includes discussion that will help new parents transition into the immediate postpartum period. Maternity Care Center Tour of [facility] is included. We suggest taking this class between 25-32 weeks, but it's only a recommendation. $75 per registrant and one support person or $30 Medicaid. Childbirth Class- 3 week Series: This option of 3 weekly classes helps you and your labor partner prepare for childbirth. Newborn  care, labor & birth, cesarean birth, pain management, and comfort techniques are discussed and a Maternity Care Center Tour of [facility] is included. The class meets at the same time, on the same day of the week for 3 consecutive weeks beginning with the starting date you choose. $60 for registrant and one support person.  Marvelous Multiples: Expecting twins, triplets, or more? This class covers the differences in labor, birth, parenting, and breastfeeding issues that face multiples' parents. NICU tour is included. Led by a Certified Childbirth Educator who is the mother of twins. No fee. Caring for Baby: This class is for expectant and adoptive parents who want to learn and practice the most up-to-date newborn care for their babies. Focus is on birth through the first six weeks of life. Topics include feeding, bathing, diapering, crying, umbilical cord care, circumcision care and safe sleep. Parents learn to recognize symptoms of illness and when to call the pediatrician. Register only the mom-to-be and your partner or support person can plan to come with you! $10 per registrant and support person Childbirth Class- online option: This online class offers you the freedom to complete a Birth and Baby series in the comfort of your own home. The flexibility of this option allows you to review sections at your own pace, at times convenient to you and your support people. It includes additional video information, animations, quizzes, and extended activities. Get organized with helpful eClass tools, checklists, and trackers. Once you register online for the class, you will receive an email within a few days to accept the invitation and begin the class when the time   is right for you. The content will be available to you for 60 days. $60 for 60 days of online access for you and your support people.  DOULA LIST   Beautiful Beginnings Doula  [place]  [phone]  [email or web address] .[email or web address]   [email or web address]  Zula the Maurine Cane [phone]  [email or web address]   Company secretary, LLC   Precious Reece Leader   [email or web address]   ??THE MOTHERLY DOULA?? Lajuana Ripple   [phone]   [email or web address] .[email or web address]     The Abundant Life Doula  Mattie Marlin  [phone]    [email or web address] .[email or web address]     [phone]     [email or web address] .[email or web address]  [email or web address]   Lenoria Farrier: [place] [phone]       [email or web address] .[email or web address]  [email or web address]   [place] [phone]   [email or web address] [place], [place]  Llana Aliment  [phone]  [email or web address] .[email or web address]  AffordableShare.[email or web address]   Ease [facility]   Burney Gauze   [phone]  [email or web address] .[email or web address] www.[email or web address]  [phone] [email or web address]'$ .[email or web address] PopularFlicks.[email or web address]  Natural Baby Doulas  Almyra Brace         [facility]       Lora Reynolds     [phone] contact'[email or web address]'$ .com  [email or web address]   [facility]   [place] [phone] Info'@[email or web address]'$ .[email or web address]   Devoted Doula Services  Abbie Sons     [phone]  [email or web address] .[email or web address]  [facility]     [phone]  [email or web address] .[email or web address]  Facebook and IG '@soleildoula'$ .Vivia Birmingham  [phone] bccooper'[email or web address]'$ .Dola Argyle [phone] [email or web address] .[email or web address]   Delanna Ahmadi  [phone] [email or web address] .[email or web address]     [facility]  [phone] [email or web address]'$ .[email or web address]   IG '@madisonmansonphotography'$    Henrine Screws    [phone] [email or web address] .[email or web address]   Rachel Moulds "[place]" Free  [phone]  [email or web address] .[email or web address]    Mtende Roll  [phone] [email or web address]'$ .[email or web address]   Radonna Ricker   [email or web address] .[email or web address]    Crissie Reese    [phone] [email or web address] .[email or web address]     Carlean Jews  [phone] [email or web address]'$ .Dillard Cannon  [phone] [email or web address] .[email or web address]    Shayla Rhem    [phone]      Considering Doren Custard? Guide for patients at [facility] [employer]  Why consider waterbirth?  . Gentle birth for babies . Less pain medicine used in labor . May allow for passive descent/less pushing . May reduce perineal tears  . More mobility and instinctive maternal position changes . Increased maternal relaxation . Reduced blood pressure in labor  Is waterbirth safe? What are the risks of infection, drowning or other complications?  . Infection: o Very low risk (3.7 % for tub vs 4.8% for bed) o 7 in 8000 waterbirths with documented infection o Poorly cleaned equipment most common cause o Slightly lower group B strep transmission rate  . Drowning o Maternal:  - Very low risk   - Related to seizures or fainting o Newborn:  - Very low risk. No evidence of increased risk of respiratory problems in multiple large studies - Physiological protection from breathing under water - Avoid underwater birth if there are any fetal complications - Once baby's head is out of the water, keep it out.  . Birth complication o Some reports of cord trauma, but risk decreased by bringing baby to surface gradually o No evidence of increased risk of shoulder dystocia. Mothers  can usually change positions faster in water than in a bed, possibly aiding the maneuvers to free the shoulder.   You must attend a Doren Custard class at [facility]  3rd Wednesday of every month from 7-9pm  [employer] by calling [phone] or online at [email or web address]  Bring [place] the certificate from the class to your prenatal appointment  Meet with a  midwife at 36 weeks to see if you can still plan a waterbirth and to sign the consent.   If you plan a waterbirth at [facility] and [facility] at [facility], you will need to purchase the following:  Fish net  Bathing suit top (optional)  Long-handled mirror (optional)  Places to purchase or rent supplies:   GotWebTools.is for tub purchases and supplies  Waterbirthsolutions.com for tub purchases and supplies  The Labor Ladies (www.[email or web address]) $275 for tub rental/set-up & take down/kit   [employer] (http://www.fleming.[email or web address]) Information regarding doulas (labor support) who provide pool rentals  Things that would prevent you from having a waterbirth:  Premature, <37wks  Previous cesarean birth  Presence of thick meconium-stained fluid  Multiple gestation (Twins, triplets, etc.)  Uncontrolled diabetes or gestational diabetes requiring medication  Hypertension requiring medication or diagnosis of pre-eclampsia  Heavy vaginal bleeding  Non-reassuring fetal heart rate  Active infection (MRSA, etc.). Group B Strep is NOT a contraindication for waterbirth.  If your labor has to be induced and induction method requires continuous monitoring of the baby's heart rate  Other risks/issues identified by your obstetrical provider  Please remember that birth is unpredictable. Under certain unforeseeable circumstances your provider may advise against giving birth in the tub. These decisions will be made on a case-by-case basis and with the safety of you and your baby as our highest priority.     Marland Kitchen      Marland Kitchen

## 2020-02-01 ENCOUNTER — Encounter: Payer: Self-pay | Admitting: Advanced Practice Midwife

## 2020-02-17 ENCOUNTER — Encounter: Payer: Self-pay | Admitting: Advanced Practice Midwife

## 2020-02-22 ENCOUNTER — Other Ambulatory Visit: Payer: Self-pay

## 2020-02-22 ENCOUNTER — Inpatient Hospital Stay (HOSPITAL_BASED_OUTPATIENT_CLINIC_OR_DEPARTMENT_OTHER): Payer: Medicaid Other

## 2020-02-22 ENCOUNTER — Encounter (HOSPITAL_BASED_OUTPATIENT_CLINIC_OR_DEPARTMENT_OTHER): Payer: Self-pay | Admitting: *Deleted

## 2020-02-22 ENCOUNTER — Observation Stay (HOSPITAL_BASED_OUTPATIENT_CLINIC_OR_DEPARTMENT_OTHER)
Admission: EM | Admit: 2020-02-22 | Discharge: 2020-02-22 | Disposition: A | Discharge status: Home / Self Care | Payer: Medicaid Other | Attending: Family Medicine | Admitting: Family Medicine

## 2020-02-22 DIAGNOSIS — U071 COVID-19: Secondary | ICD-10-CM | POA: Diagnosis present

## 2020-02-22 DIAGNOSIS — Z79899 Other long term (current) drug therapy: Secondary | ICD-10-CM | POA: Diagnosis not present

## 2020-02-22 DIAGNOSIS — O99012 Anemia complicating pregnancy, second trimester: Secondary | ICD-10-CM | POA: Insufficient documentation

## 2020-02-22 DIAGNOSIS — O2312 Infections of bladder in pregnancy, second trimester: Secondary | ICD-10-CM | POA: Diagnosis not present

## 2020-02-22 DIAGNOSIS — M791 Myalgia, unspecified site: Secondary | ICD-10-CM | POA: Diagnosis not present

## 2020-02-22 DIAGNOSIS — O99342 Other mental disorders complicating pregnancy, second trimester: Secondary | ICD-10-CM | POA: Diagnosis not present

## 2020-02-22 DIAGNOSIS — Z3A27 27 weeks gestation of pregnancy: Secondary | ICD-10-CM | POA: Insufficient documentation

## 2020-02-22 DIAGNOSIS — Z3A26 26 weeks gestation of pregnancy: Secondary | ICD-10-CM | POA: Diagnosis not present

## 2020-02-22 DIAGNOSIS — N3000 Acute cystitis without hematuria: Secondary | ICD-10-CM | POA: Diagnosis present

## 2020-02-22 DIAGNOSIS — O98512 Other viral diseases complicating pregnancy, second trimester: Secondary | ICD-10-CM | POA: Diagnosis not present

## 2020-02-22 DIAGNOSIS — Z8616 Personal history of COVID-19: Secondary | ICD-10-CM | POA: Diagnosis present

## 2020-02-22 DIAGNOSIS — F909 Attention-deficit hyperactivity disorder, unspecified type: Secondary | ICD-10-CM | POA: Diagnosis not present

## 2020-02-22 LAB — BASIC METABOLIC PANEL
Anion gap: 7 (ref 5–15)
Anion gap: 10 (ref 5–15)
BUN: 11 mg/dL (ref 6–20)
BUN: 7 mg/dL (ref 6–20)
CO2: 21 mmol/L — ABNORMAL LOW (ref 22–32)
CO2: 18 mmol/L — ABNORMAL LOW (ref 22–32)
Calcium: 8.8 mg/dL — ABNORMAL LOW (ref 8.9–10.3)
Calcium: 8.4 mg/dL — ABNORMAL LOW (ref 8.9–10.3)
Chloride: 105 mmol/L (ref 98–111)
Chloride: 104 mmol/L (ref 98–111)
Creatinine, Ser: 0.78 mg/dL (ref 0.44–1.00)
Creatinine, Ser: 0.67 mg/dL (ref 0.44–1.00)
GFR calc Af Amer: >60 mL/min (ref >60)
GFR calc Af Amer: >60 mL/min (ref >60)
GFR calc non Af Amer: >60 mL/min (ref >60)
GFR calc non Af Amer: >60 mL/min (ref >60)
Glucose, Bld: 84 mg/dL (ref 70–99)
Glucose, Bld: 92 mg/dL (ref 70–99)
Potassium: 3.7 mmol/L (ref 3.5–5.1)
Potassium: 3.2 mmol/L — ABNORMAL LOW (ref 3.5–5.1)
Sodium: 133 mmol/L — ABNORMAL LOW (ref 135–145)
Sodium: 132 mmol/L — ABNORMAL LOW (ref 135–145)

## 2020-02-22 LAB — RESPIRATORY PANEL BY RT PCR (FLU A&B, COVID)
Influenza A by PCR: NEGATIVE
Influenza B by PCR: NEGATIVE
SARS Coronavirus 2 by RT PCR: POSITIVE — AB

## 2020-02-22 LAB — C-REACTIVE PROTEIN: CRP: 0.9 mg/dL (ref <1.0)

## 2020-02-22 LAB — CBC WITH DIFFERENTIAL/PLATELET
Abs Immature Granulocytes: 0.02 10*3/uL (ref 0.00–0.07)
Basophils Absolute: 0 10*3/uL (ref 0.0–0.1)
Basophils Relative: 0 %
Eosinophils Absolute: 0.1 10*3/uL (ref 0.0–0.5)
Eosinophils Relative: 1 %
HCT: 29.9 % — ABNORMAL LOW (ref 36.0–46.0)
Hemoglobin: 10 g/dL — ABNORMAL LOW (ref 12.0–15.0)
Immature Granulocytes: 0 %
Lymphocytes Relative: 12 %
Lymphs Abs: 1 10*3/uL (ref 0.7–4.0)
MCH: 33 pg (ref 26.0–34.0)
MCHC: 33.4 g/dL (ref 30.0–36.0)
MCV: 98.7 fL (ref 80.0–100.0)
Monocytes Absolute: 1.7 10*3/uL — ABNORMAL HIGH (ref 0.1–1.0)
Monocytes Relative: 20 %
Neutro Abs: 5.9 10*3/uL (ref 1.7–7.7)
Neutrophils Relative %: 67 %
Platelets: 215 10*3/uL (ref 150–400)
RBC: 3.03 MIL/uL — ABNORMAL LOW (ref 3.87–5.11)
RDW: 12.6 % (ref 11.5–15.5)
WBC: 8.6 10*3/uL (ref 4.0–10.5)
nRBC: 0 % (ref 0.0–0.2)

## 2020-02-22 LAB — URINALYSIS, MICROSCOPIC (REFLEX)

## 2020-02-22 LAB — URINALYSIS, ROUTINE W REFLEX MICROSCOPIC
Bilirubin Urine: NEGATIVE
Glucose, UA: NEGATIVE mg/dL
Hgb urine dipstick: NEGATIVE
Ketones, ur: 15 mg/dL — AB
Nitrite: NEGATIVE
Protein, ur: NEGATIVE mg/dL
Specific Gravity, Urine: >1.03 — ABNORMAL HIGH (ref 1.005–1.030)
pH: 6 (ref 5.0–8.0)

## 2020-02-22 LAB — D-DIMER, QUANTITATIVE: D-Dimer, Quant: 1.41 ug{FEU}/mL — ABNORMAL HIGH (ref 0.00–0.50)

## 2020-02-22 LAB — FERRITIN: Ferritin: 14 ng/mL (ref 11–307)

## 2020-02-22 LAB — PROCALCITONIN: Procalcitonin: <0.1 ng/mL

## 2020-02-22 LAB — FIBRINOGEN: Fibrinogen: 427 mg/dL (ref 210–475)

## 2020-02-22 MED ORDER — SODIUM CHLORIDE 0.9 % IV SOLN
1.0000 g | Freq: Once | INTRAVENOUS | Status: AC
  Administered 2020-02-22: 03:00:00 1 g via INTRAVENOUS
  Filled 2020-02-22: qty 10

## 2020-02-22 MED ORDER — PRENATAL MULTIVITAMIN CH: 1.0000 | ORAL_TABLET | Freq: Every day | ORAL | Status: DC

## 2020-02-22 MED ORDER — POTASSIUM CHLORIDE CRYS ER 20 MEQ PO TBCR
40.0000 meq | EXTENDED_RELEASE_TABLET | Freq: Once | ORAL | Status: AC
  Administered 2020-02-22: 40 meq via ORAL
  Filled 2020-02-22: qty 2

## 2020-02-22 MED ORDER — SODIUM CHLORIDE 0.9% FLUSH: 3.0000 mL | Freq: Two times a day (BID) | INTRAVENOUS | Status: DC

## 2020-02-22 MED ORDER — SODIUM CHLORIDE 0.9 % IV BOLUS
500.0000 mL | Freq: Once | INTRAVENOUS | Status: AC
  Administered 2020-02-22: 500 mL via INTRAVENOUS

## 2020-02-22 MED ORDER — CEPHALEXIN 500 MG PO CAPS: 500.0000 mg | ORAL_CAPSULE | Freq: Four times a day (QID) | ORAL | Status: DC

## 2020-02-22 MED ORDER — PRENATAL MULTIVITAMIN CH
1.0000 | ORAL_TABLET | Freq: Every day | ORAL | Status: DC
  Administered 2020-02-22: 1 via ORAL
  Filled 2020-02-22: qty 1

## 2020-02-22 MED ORDER — IRON 325 (65 FE) MG PO TABS: 1.0000 | ORAL_TABLET | Freq: Every day | ORAL | 0 refills | Status: DC

## 2020-02-22 MED ORDER — SODIUM CHLORIDE 0.9 % IV SOLN: 250.0000 mL | INTRAVENOUS | Status: DC | PRN

## 2020-02-22 MED ORDER — ACETAMINOPHEN 500 MG PO TABS
1000.0000 mg | ORAL_TABLET | Freq: Once | ORAL | Status: AC
  Administered 2020-02-22: 1000 mg via ORAL
  Filled 2020-02-22: qty 2

## 2020-02-22 MED ORDER — HEPARIN SODIUM (PORCINE) 5000 UNIT/ML IJ SOLN
5000.0000 [IU] | Freq: Three times a day (TID) | INTRAMUSCULAR | Status: DC
  Filled 2020-02-22: qty 1

## 2020-02-22 MED ORDER — SODIUM CHLORIDE 0.9% FLUSH: 3.0000 mL | INTRAVENOUS | Status: DC | PRN

## 2020-02-22 NOTE — Progress Notes (Signed)
Patient discharged home, family to transport

## 2020-02-22 NOTE — ED Provider Notes (Signed)
[facility] EMERGENCY DEPARTMENT Provider Note   CSN: [id number] Arrival date & time: 02/22/20  0026     History Chief Complaint  Jasmine Thornton presents with  . Generalized Body Aches    Jasmine Thornton is a 24 y.o. female.  The history is provided by the Jasmine Thornton.  URI Presenting symptoms: fever   Presenting symptoms: no congestion   Presenting symptoms comment:  Chills, sore throat  Severity:  Moderate Onset quality:  Gradual Duration:  1 day Timing:  Constant Progression:  Unchanged Chronicity:  New Relieved by:  Nothing Worsened by:  Nothing Ineffective treatments:  None tried Associated symptoms: headaches and myalgias   Risk factors: not elderly, no diabetes mellitus and no recent travel   Jasmine Thornton [redacted] weeks pregnant presents with body aches fevers and chills and sore throat as well as low back pain.       Past Medical History:  Diagnosis Date  . Acne   . ADHD (attention deficit hyperactivity disorder), combined type   . Allergic rhinitis   . Asthma   . Headache   . Herpes genitalis in women   . Trichomoniasis     Jasmine Thornton Active Problem List   Diagnosis Date Noted  . Nausea and vomiting in pregnancy prior to [redacted] weeks gestation 11/08/2019  . Genital herpes affecting pregnancy, antepartum 11/08/2019  . Supervision of low-risk first pregnancy, unspecified trimester 10/31/2019  . Herpes genitalis in women 10/31/2019  . Asthma     Past Surgical History:  Procedure Laterality Date  . NO PAST SURGERIES       OB History    Gravida  1   Para      Term      Preterm      AB      Living        SAB      TAB      Ectopic      Multiple      Live Births              Family History  Problem Relation Age of Onset  . Allergies Mother   . Anemia Mother   . Heart disease Maternal Grandmother     Social History   Tobacco Use  . Smoking status: Never Smoker  . Smokeless tobacco: Never Used  Substance Use Topics  . Alcohol use: Not  Currently    Alcohol/week: 0.0 standard drinks    Comment: occasionally  . Drug use: No    Home Medications Prior to Admission medications   Medication Sig Start Date End Date Taking? Authorizing Provider  albuterol (PROVENTIL HFA;VENTOLIN HFA) 108 (90 BASE) MCG/ACT inhaler Inhale into the lungs every 6 (six) hours as needed for wheezing or shortness of breath.    [provider]  Blood Pressure Monitoring (BLOOD PRESSURE KIT) DEVI 1 Device by Does not apply route as needed. 10/31/19   Virginia Rochester, NP  butalbital-acetaminophen-caffeine (FIORICET) [phone] MG tablet Take 1-2 tablets by mouth every 6 (six) hours as needed for headache. 12/06/19 12/05/20  Marcille Buffy D, CNM  Doxylamine-Pyridoxine (DICLEGIS) 10-10 MG TBEC Take 2 tablets at bedtime and one in the morning and one in the afternoon as needed for nausea. Jasmine Thornton not taking: Reported on 12/06/2019 11/08/19   Virginia Rochester, NP  Magnesium 500 MG CAPS Take 1 capsule (500 mg total) by mouth 2 (two) times daily. 12/06/19   Tresea Mall, CNM  Prenatal MV-Min-FA-Omega-3 (PRENATAL GUMMIES/DHA & FA PO) Take 2  tablets by mouth daily.    [provider]    Allergies    Jasmine Thornton has no known allergies.  Review of Systems   Review of Systems  Constitutional: Positive for fever.  HENT: Negative for congestion.   Eyes: Negative for visual disturbance.  Respiratory: Negative for shortness of breath.   Cardiovascular: Negative for chest pain.  Gastrointestinal: Negative for abdominal pain.  Genitourinary: Negative for dysuria and vaginal bleeding.  Musculoskeletal: Positive for myalgias.  Neurological: Positive for headaches.  Psychiatric/Behavioral: Negative for agitation.  All other systems reviewed and are negative.   Physical Exam Updated Vital Signs BP 116/71   Pulse (!) 121   Temp 99.4 F (37.4 C) (Oral)   Resp 18   Ht '5\' 2"'  (1.575 m)   Wt 65.8 kg   LMP 08/23/2019 (Exact Date) Comment: Gravidia  1, Para 0, AB 0  SpO2 100%   BMI 26.52 kg/m   Physical Exam  ED Results / Procedures / Treatments   Labs (all labs ordered are listed, but only abnormal results are displayed) Results for orders placed or performed during the hospital encounter of 02/22/20  Respiratory Panel by RT PCR (Flu A&B, Covid) - Nasopharyngeal Swab   Specimen: Nasopharyngeal Swab  Result Value Ref Range   SARS Coronavirus 2 by RT PCR POSITIVE (A) NEGATIVE   Influenza A by PCR NEGATIVE NEGATIVE   Influenza B by PCR NEGATIVE NEGATIVE  Urinalysis, Routine w reflex microscopic  Result Value Ref Range   Color, Urine YELLOW YELLOW   APPearance CLOUDY (A) CLEAR   Specific Gravity, Urine >1.030 (H) 1.005 - 1.030   pH 6.0 5.0 - 8.0   Glucose, UA NEGATIVE NEGATIVE mg/dL   Hgb urine dipstick NEGATIVE NEGATIVE   Bilirubin Urine NEGATIVE NEGATIVE   Ketones, ur 15 (A) NEGATIVE mg/dL   Protein, ur NEGATIVE NEGATIVE mg/dL   Nitrite NEGATIVE NEGATIVE   Leukocytes,Ua TRACE (A) NEGATIVE  CBC with Differential/Platelet  Result Value Ref Range   WBC 8.6 4.0 - 10.5 K/uL   RBC 3.03 (L) 3.87 - 5.11 MIL/uL   Hemoglobin 10.0 (L) 12.0 - 15.0 g/dL   HCT 29.9 (L) 36.0 - 46.0 %   MCV 98.7 80.0 - 100.0 fL   MCH 33.0 26.0 - 34.0 pg   MCHC 33.4 30.0 - 36.0 g/dL   RDW 12.6 11.5 - 15.5 %   Platelets 215 150 - 400 K/uL   nRBC 0.0 0.0 - 0.2 %   Neutrophils Relative % 67 %   Neutro Abs 5.9 1.7 - 7.7 K/uL   Lymphocytes Relative 12 %   Lymphs Abs 1.0 0.7 - 4.0 K/uL   Monocytes Relative 20 %   Monocytes Absolute 1.7 (H) 0.1 - 1.0 K/uL   Eosinophils Relative 1 %   Eosinophils Absolute 0.1 0.0 - 0.5 K/uL   Basophils Relative 0 %   Basophils Absolute 0.0 0.0 - 0.1 K/uL   Immature Granulocytes 0 %   Abs Immature Granulocytes 0.02 0.00 - 0.07 K/uL  Basic metabolic panel  Result Value Ref Range   Sodium 133 (L) 135 - 145 mmol/L   Potassium 3.7 3.5 - 5.1 mmol/L   Chloride 105 98 - 111 mmol/L   CO2 21 (L) 22 - 32 mmol/L   Glucose,  Bld 84 70 - 99 mg/dL   BUN 11 6 - 20 mg/dL   Creatinine, Ser 0.78 0.44 - 1.00 mg/dL   Calcium 8.8 (L) 8.9 - 10.3 mg/dL   GFR calc non  Af Amer >60 >60 mL/min   GFR calc Af Amer >60 >60 mL/min   Anion gap 7 5 - 15  Urinalysis, Microscopic (reflex)  Result Value Ref Range   RBC / HPF 0-5 0 - 5 RBC/hpf   WBC, UA 6-10 0 - 5 WBC/hpf   Bacteria, UA MANY (A) NONE SEEN   Squamous Epithelial / LPF 11-20 0 - 5   No results found.  EKG None  Radiology No results found.  Procedures Procedures (including critical care time)  Medications Ordered in ED Medications  cefTRIAXone (ROCEPHIN) 1 g in sodium chloride 0.9 % 100 mL IVPB (has no administration in time range)    ED Course  I have reviewed the triage vital signs and the nursing notes.  Pertinent labs & imaging results that were available during my care of the Jasmine Thornton were reviewed by me and considered in my medical decision making (see chart for details).   toco transmitted to women's no contractions.  Jasmine Thornton is cleared from that perspective.   Final Clinical Impression(s) / ED Diagnoses Final diagnoses:  Acute cystitis without hematuria  COVID-19   Admit to family practice Dr. Matilde Sprang  For covid and uti   Kathi Dohn, MD 02/22/20 [id number]

## 2020-02-22 NOTE — ED Notes (Signed)
Per Toni Amend, RN from [facility] [facility] looks great and pt is cleared from their stand point.  Dr. Nicanor Alcon is aware.

## 2020-02-22 NOTE — ED Triage Notes (Addendum)
Pt c/o generalized body aches , fever, chills x 1 day , pt is 27 weeks preg and states lower abd and back pain

## 2020-02-22 NOTE — ED Notes (Signed)
Pt on Toco monitor, and OB rapid response nurse contacted.

## 2020-02-22 NOTE — Discharge Summary (Signed)
[facility] Discharge Summary  Patient name: Jasmine Thornton Medical record number: [id number] Date of birth: 1996-07-26 Age: 24 y.o. Gender: female Date of Admission: 02/22/2020  Date of Discharge: 02/22/20 Admitting Physician: Gerlene Fee, DO  Primary Care Provider: Maudry Mayhew, NP (Inactive) Consultants: Discussed case with OB/GYN  Indication for Hospitalization: COVID 19 infection with myalgias   Discharge Diagnoses/Problem List:  Principal Problem:   COVID-19 Active Problems:   Acute cystitis without hematuria   [redacted] weeks gestation of pregnancy   Myalgia  Disposition: discharge home   Discharge Condition: stable   Discharge Exam:   Physical Exam Constitutional:      Appearance: Normal appearance. She is not ill-appearing.  HENT:     Head: Normocephalic.  Eyes:     Conjunctiva/sclera: Conjunctivae normal.  Pulmonary:     Effort: Pulmonary effort is normal. No respiratory distress.     Breath sounds: Normal breath sounds. No wheezing or rales.  Abdominal:     Comments: Gravid uterus, minimal bowel sounds present, abdomen soft to palpation   Skin:    General: Skin is warm and dry.     Capillary Refill: Capillary refill takes less than 2 seconds.     Findings: No rash.  Neurological:     Mental Status: She is alert and oriented to person, place, and time.    Brief Hospital Course:   Jasmine Thornton is a 24 y.o. female who presented to [facility] with myalgias and was admitted for positive Covid testing and urinary tract infection in setting of pregnancy. Jasmine Thornton is a G1 P0 at 26+1 with medical history is significant for well-controlled asthma & HSV.   COVID 19 Infection in pregnancy, GI 26 weeks 1 day Patient presented to [facility] with myalgias.  Patient was found to have Covid positive test.  Patient's case was discussed with OB provider on call and with normal fetal monitoring, patient was cleared from  obstetrics perspective.  Patient was monitored in ED and given pregnancy and concern for potential respiratory decline as well as urinalysis with leukocytes and many bacteria, she was admitted.  Vitals were notable for intermittent tachycardia. Patient was afebrile and did not require supplemental oxygen.  D-dimer was 1.41 and fibrinogen within normal limits at 427.  Later in afternoon on the day of admission, patient remained stable on room air and with clearance from obstetrics, she was discharged home.  Anemia pregnancy Patient was noted to have hemoglobin of 10 and recommended to start iron with prenatal vitamin as outpatient.  Symptomatic bacteriuria Urine analysis was notable for leukocytes and many bacteria on admission.  Urine culture was collected and grew greater than 50,000 lactobacillus.  Antibiotics were not recommended after discharge.  Patient was treated with 1 dose ceftriaxone in ED received no other antibiotics during admission.   Issues for Follow Up:  1. Anemia of Pregnancy: prescribed multivitamin with iron at the time of discharge. Please check CBC at outpatient follow up visit.   Significant Procedures: None  Significant Labs and Imaging:  Recent Labs  Lab 02/22/20 0204  WBC 8.6  HGB 10.0*  HCT 29.9*  PLT 215   Recent Labs  Lab 02/22/20 0204 02/22/20 0648  NA 133* 132*  K 3.7 3.2*  CL 105 104  CO2 21* 18*  GLUCOSE 84 92  BUN 11 7  CREATININE 0.78 0.67  CALCIUM 8.8* 8.4*    DG Chest Portable 1 View  Result Date: 02/22/2020 CLINICAL DATA:  COVID. EXAM: PORTABLE CHEST 1 VIEW COMPARISON:  10/29/2018 FINDINGS: Normal heart size and mediastinal contours. No acute infiltrate or edema. No effusion or pneumothorax. No acute osseous findings. IMPRESSION: Negative chest. Electronically Signed   By: Monte Fantasia M.D.   On: 02/22/2020 04:25    Results/Tests Pending at Time of Discharge: None  Discharge Medications:  Allergies as of 02/22/2020   No Known  Allergies     Medication List    STOP taking these medications   PRENATAL GUMMIES/DHA & FA PO Replaced by: prenatal multivitamin Tabs tablet     TAKE these medications   albuterol 108 (90 Base) MCG/ACT inhaler Commonly known as: VENTOLIN HFA Inhale into the lungs every 6 (six) hours as needed for wheezing or shortness of breath. Notes to patient: Resume home schedule 02/22/20   Blood Pressure Kit Devi 1 Device by Does not apply route as needed. Notes to patient: Resume home schedule 02/22/20   Iron 325 (65 Fe) MG Tabs Take 1 tablet (325 mg total) by mouth daily. Notes to patient: Resume home schedule 02/23/20   prenatal multivitamin Tabs tablet Take 1 tablet by mouth daily. Replaces: PRENATAL GUMMIES/DHA & FA PO Notes to patient: Resume home schedule 02/23/20       Discharge Instructions: Please refer to Patient Instructions section of EMR for full details.  Patient was counseled important signs and symptoms that should prompt return to medical care, changes in medications, dietary instructions, activity restrictions, and follow up appointments.   Follow-Up Appointments: Follow-up Information    Maudry Mayhew, NP. Schedule an appointment as soon as possible for a visit in 3 day(s).   Specialty: Pediatrics Why: for hospital follow up Contact information: [place] [address] [phone]           Stark Klein, MD 02/25/2020, 2:30 PM PGY-1, [place]

## 2020-02-22 NOTE — Plan of Care (Signed)
Problem: Education: Goal: Knowledge of General Education information will improve Description: Including pain rating scale, medication(s)/side effects and non-pharmacologic comfort measures 02/22/2020 1532 by Henrene Hawking, RN Outcome: Adequate for Discharge 02/22/2020 1532 by Henrene Hawking, RN Outcome: Adequate for Discharge   Problem: Health Behavior/Discharge Planning: Goal: Ability to manage health-related needs will improve 02/22/2020 1532 by Henrene Hawking, RN Outcome: Adequate for Discharge 02/22/2020 1532 by Henrene Hawking, RN Outcome: Adequate for Discharge   Problem: Clinical Measurements: Goal: Ability to maintain clinical measurements within normal limits will improve 02/22/2020 1532 by Henrene Hawking, RN Outcome: Adequate for Discharge 02/22/2020 1532 by Henrene Hawking, RN Outcome: Adequate for Discharge Goal: Will remain free from infection 02/22/2020 1532 by Henrene Hawking, RN Outcome: Adequate for Discharge 02/22/2020 1532 by Henrene Hawking, RN Outcome: Adequate for Discharge Goal: Diagnostic test results will improve 02/22/2020 1532 by Henrene Hawking, RN Outcome: Adequate for Discharge 02/22/2020 1532 by Henrene Hawking, RN Outcome: Adequate for Discharge Goal: Respiratory complications will improve 02/22/2020 1532 by Henrene Hawking, RN Outcome: Adequate for Discharge 02/22/2020 1532 by Henrene Hawking, RN Outcome: Adequate for Discharge Goal: Cardiovascular complication will be avoided 02/22/2020 1532 by Henrene Hawking, RN Outcome: Adequate for Discharge 02/22/2020 1532 by Henrene Hawking, RN Outcome: Adequate for Discharge   Problem: Activity: Goal: Risk for activity intolerance will decrease 02/22/2020 1532 by Henrene Hawking, RN Outcome: Adequate for Discharge 02/22/2020 1532 by Henrene Hawking, RN Outcome: Adequate for Discharge   Problem: Nutrition: Goal: Adequate nutrition will be  maintained 02/22/2020 1532 by Henrene Hawking, RN Outcome: Adequate for Discharge 02/22/2020 1532 by Henrene Hawking, RN Outcome: Adequate for Discharge   Problem: Coping: Goal: Level of anxiety will decrease 02/22/2020 1532 by Henrene Hawking, RN Outcome: Adequate for Discharge 02/22/2020 1532 by Henrene Hawking, RN Outcome: Adequate for Discharge   Problem: Elimination: Goal: Will not experience complications related to bowel motility 02/22/2020 1532 by Henrene Hawking, RN Outcome: Adequate for Discharge 02/22/2020 1532 by Henrene Hawking, RN Outcome: Adequate for Discharge Goal: Will not experience complications related to urinary retention 02/22/2020 1532 by Henrene Hawking, RN Outcome: Adequate for Discharge 02/22/2020 1532 by Henrene Hawking, RN Outcome: Adequate for Discharge   Problem: Pain Managment: Goal: General experience of comfort will improve 02/22/2020 1532 by Henrene Hawking, RN Outcome: Adequate for Discharge 02/22/2020 1532 by Henrene Hawking, RN Outcome: Adequate for Discharge   Problem: Safety: Goal: Ability to remain free from injury will improve 02/22/2020 1532 by Henrene Hawking, RN Outcome: Adequate for Discharge 02/22/2020 1532 by Henrene Hawking, RN Outcome: Adequate for Discharge   Problem: Skin Integrity: Goal: Risk for impaired skin integrity will decrease 02/22/2020 1532 by Henrene Hawking, RN Outcome: Adequate for Discharge 02/22/2020 1532 by Henrene Hawking, RN Outcome: Adequate for Discharge   Problem: Education: Goal: Knowledge of risk factors and measures for prevention of condition will improve 02/22/2020 1532 by Henrene Hawking, RN Outcome: Adequate for Discharge 02/22/2020 1532 by Henrene Hawking, RN Outcome: Adequate for Discharge   Problem: Coping: Goal: Psychosocial and spiritual needs will be supported 02/22/2020 1532 by Henrene Hawking, RN Outcome: Adequate for Discharge 02/22/2020  1532 by Henrene Hawking, RN Outcome: Adequate for Discharge   Problem: Respiratory: Goal: Will maintain a patent airway 02/22/2020 1532 by Henrene Hawking, RN Outcome: Adequate for Discharge 02/22/2020 1532 by Henrene Hawking, RN Outcome: Adequate  for Discharge Goal: Complications related to the disease process, condition or treatment will be avoided or minimized 02/22/2020 1532 by Anders Simmonds, RN Outcome: Adequate for Discharge 02/22/2020 1532 by Anders Simmonds, RN Outcome: Adequate for Discharge

## 2020-02-22 NOTE — Progress Notes (Signed)
Patient given discharge instructions and teaching, no new questions or concerns. IV and tele removed.

## 2020-02-22 NOTE — Plan of Care (Signed)

## 2020-02-22 NOTE — Discharge Instructions (Signed)
You were admitted to the hospital for concern that you would need extra oxyen due to COVID-19 infection and potential UTI. We will contact you with the results of your urine culture if you will need antibiotics in the next few days. Please be sure to isolate at home for 10 days (to end on 03/03/20) and continue to wash your hands frequently and wear a mask when you have to be around others. Try to drink plenty of fluids to remain hydrated.  It is important that you make a follow up appointment with your Family doctor within 1 week. Please be sure to see your OB/GYN as scheduled, sooner if any concerns.  Please return to care if you become unable to breathe at rest.

## 2020-02-22 NOTE — Progress Notes (Signed)
10[name] called about pt presenting to ED c/o body aches, fever, chills x1day, lower abdominal pain, and back pain. [redacted] weeks GA.  0138: Pt placed on monitor.  0204: Dr. Adrian Blackwater called and notified about pt 26.1 GA . G1P0. To ED w/ c/o body aches, fever, chills x1day, lower abdominal pain, and back pain that she's said lasted throughout her pregnancy. Hx of asthma and herpes. NST reactive FHR 155 w/ mod variability no decels.  No ctx noted on monitor. No bleeding, LOF, positive fetal movement. ED MD has ordered Covid swab. OB cleared at this time.   0205[name] called [facility] spoke with Caryl Asp, RN and notified that pt is OB cleared at this time.

## 2020-02-22 NOTE — H&P (Signed)
Family Medicine Teaching [facility] Admission History and Physical Service Pager: [phone]  Patient name: Jasmine Thornton Medical record number: [id number] Date of birth: 03-16-1996 Age: 24 y.o. Gender: female  Primary Care Provider: Melanie Crazier, NP (Inactive) Consultants: IP CONSULT TO OB GYN Code Status: Full Code  Preferred Emergency Contact :    Contact Information    Name Relation Home Work [place] Mother [phone]     Derra, Shartzer Father [phone]        Chief Complaint: Generalized Body Aches   Assessment and Plan: Jasmine Thornton is a 24 y.o. female who presented to med [facility] with myalgias and was admitted for positive Covid testing and urinary tract infection in setting of pregnancy. Jasmine Thornton is a G1 P0 at 26+1 with medical history is significant for well-controlled asthma, HSV.  She denies any other medical history, surgical history.   Covid 19 Patient reports she started feeling symptoms on 02/21/2020 when she woke up.  She had only mild congestion initially but then started to develop more severe myalgias and chills throughout the day.  She denies any difficulties with breathing, dyspnea on exertion, lower extremity edema, chest pain, fevers, cough.  She reports fetal movement, denies loss of fluids, vaginal bleeding, contractions.  Currently, patient is not requiring any oxygen and her vital signs are otherwise stable except for some tachycardia to the low 100s.  On physical exam, patient is in no acute distress, lying comfortably in bed.  She does not have any increased work of breathing, BLEE, coughing, wheezing.  Her biggest complaint at this time is generalized myalgias.  Given status, will not start any dexamethasone or remdesivir at this time.  Will obtain fibrinogen, CRP, D-dimer, other risk ratification labs for COVID-19 and reconsider decision for remdesivir with results . Admit to FMTS, attending Dr. McDiarmid. Level of care:  telemetry . Continuous cardiac monitoring  . Vitals per unit routine, O2 PRN for O2 > 92%, Activity OOB as tolerated,  . C/S OB/GYN for fetal wellbeing monitoring  . Follow up COVID studies . Anticipate discharge today with close o/p monitoring   Low risk pregnancy This is a G1 P0 at 26 and 1 by LMP, consistent with 6-week ultrasound.  She follows at [facility] and is compliant with appointments.  Her first trimester labs were significant for mild anemia 11.7.  She had some minimal nausea in her first trimester but otherwise has no complaints at this time.  At [facility], toco category 1, baseline 155, 15 x 15 accelerations without decelerations.  Continue daily prenatal vitamins  Asymptomatic bacteriuria Patient with asymptomatic bacteriuria.  Urine culture is currently pending.  Patient is status post 1 g ceftriaxone in the ED.  Urine ketones and elevated specific gravity likely indicate some dehydration.  Patient did not receive any bolus of fluids at the ED.  Creatinine within normal limits will bolus 500 cc normal saline.  Previous urine cultures reviewed.  Pansensitive E. coli in 2019, recent urine culture in March with suggested recollection.  Initial OB culture with multiple genital flora.  Transition to p.o. QID Keflex x6 days (first dose 4/14 - 6pm)   Mild hyponatremia Sodium is 133 in ED.  Etiology unclear at this time.  Will recheck this AM.   F/u BMP   HSV Asymptomatic.  Not on any medications at this time  Intermittent Asthma  Seasonal allergies Allergic rhinitis Patient reports very well controlled disease.  She denies any hospitalizations or any other medications  at this time.  She uses her albuterol inhaler very infrequently; last about a year ago.  Has not had to use recently.  Monitor respiratory status  Please alert team with change in respiratory status  #FEN/GI:  . Fluids: Saline lock . Electrolytes: Within normal limits . Nutrition: Regular  diet  Access: Left PIV   VTE prophylaxis: Heparin 5000 Q8H   Disposition: Admit to Covid floor, 5 W., telemetry.  ============================================================================= HPI Jasmine Thornton is a 24 y.o. female with past medical history significant for mild intermittent asthma, who presents as a transfer from med [facility] emergency department for positive COVID-19 testing and UTI in the setting of pregnancy.  Patient reports that her symptoms started 02/21/2019 1 in the morning.  Initially, her symptoms were congestion which she attributed to allergies.  Later on during the day, patient started to experience myalgias that became more severe and chills.  This is when she decided to go to the ED.   In the ED, patient's UA/Umicro was significant for bacteria, ketones and leukocytes.  Urine culture is pending.  CBC significant for hemoglobin 10, sodium 133 Abnormal Labs Reviewed  RESPIRATORY PANEL BY RT PCR (FLU A&B, COVID) - Abnormal; Notable for the following components:      Result Value   SARS Coronavirus 2 by RT PCR POSITIVE (*)    All other components within normal limits  URINALYSIS, ROUTINE W REFLEX MICROSCOPIC - Abnormal; Notable for the following components:   APPearance CLOUDY (*)    Specific Gravity, Urine >1.030 (*)    Ketones, ur 15 (*)    Leukocytes,Ua TRACE (*)    All other components within normal limits  CBC WITH DIFFERENTIAL/PLATELET - Abnormal; Notable for the following components:   RBC 3.03 (*)    Hemoglobin 10.0 (*)    HCT 29.9 (*)    Monocytes Absolute 1.7 (*)    All other components within normal limits  BASIC METABOLIC PANEL - Abnormal; Notable for the following components:   Sodium 133 (*)    CO2 21 (*)    Calcium 8.8 (*)    All other components within normal limits  URINALYSIS, MICROSCOPIC (REFLEX) - Abnormal; Notable for the following components:   Bacteria, UA MANY (*)    All other components within normal limits    Review Of  Systems: Review of Systems  Constitutional: Positive for activity change, chills, fatigue and fever. Negative for unexpected weight change.  HENT: Positive for congestion. Negative for ear pain, rhinorrhea, trouble swallowing and voice change.   Respiratory: Positive for cough. Negative for chest tightness, shortness of breath and wheezing.   Cardiovascular: Negative for chest pain, palpitations and leg swelling.  Gastrointestinal: Negative for abdominal pain, diarrhea, nausea and vomiting.  Genitourinary: Negative for difficulty urinating, dysuria, urgency, vaginal bleeding, vaginal discharge and vaginal pain.  Musculoskeletal: Positive for myalgias.  Skin: Negative for color change and pallor.  Neurological: Negative for dizziness, syncope, weakness, light-headedness and headaches.  Psychiatric/Behavioral: Negative for confusion.   Patient Active Problem List   Diagnosis Date Noted  . COVID-19 02/22/2020  . Acute cystitis without hematuria   . [redacted] weeks gestation of pregnancy   . Myalgia   . Nausea and vomiting in pregnancy prior to [redacted] weeks gestation 11/08/2019  . Genital herpes affecting pregnancy, antepartum 11/08/2019  . Supervision of low-risk first pregnancy, unspecified trimester 10/31/2019  . Herpes genitalis in women 10/31/2019  . Asthma    Past Medical History: Past Medical History:  Diagnosis Date  .  Acne   . ADHD (attention deficit hyperactivity disorder), combined type   . Allergic rhinitis   . Asthma   . Headache   . Herpes genitalis in women   . Trichomoniasis     Past Surgical History: Past Surgical History:  Procedure Laterality Date  . NO PAST SURGERIES      Family History: family history includes Allergies in her mother; Anemia in her mother; Heart disease in her maternal grandmother.   Social History: Social History   Social History Narrative  . Not on file  Lives at home with her parents and  Jasper reports that she has never smoked. She has  never used smokeless tobacco. She reports previous alcohol use. She reports that she does not use drugs.  Allergies and Medications: No Known Allergies No outpatient medications have been marked as taking for the 02/22/20 encounter [facility] Encounter).    Objective: BP 111/72 (BP Location: Right Arm)   Pulse 90   Temp 98.2 F (36.8 C) (Oral)   Resp 20   Ht 5\' 2"  (1.575 m)   Wt 65.8 kg   LMP 08/23/2019 (Exact Date) Comment: Gravidia 1, Para 0, AB 0  SpO2 100%   BMI 26.52 kg/m  Filed Weights   02/22/20 0034  Weight: 65.8 kg   Patient Vitals for the past 24 hrs:  BP Temp Temp src Pulse Resp SpO2 Height Weight  02/22/20 0600 -- 98.2 F (36.8 C) Oral 90 20 100 % -- --  02/22/20 0437 111/72 -- -- (!) 104 20 100 % -- --  02/22/20 0036 116/71 99.4 F (37.4 C) Oral (!) 121 18 100 % -- --  02/22/20 0034 -- -- -- -- -- -- 5\' 2"  (1.575 m) 65.8 kg   Exam: Physical Exam Constitutional:      General: She is not in acute distress.    Appearance: Normal appearance. She is normal weight. She is not ill-appearing or toxic-appearing.  HENT:     Head: Normocephalic and atraumatic.     Nose: Nose normal.  Eyes:     Extraocular Movements: Extraocular movements intact.     Conjunctiva/sclera: Conjunctivae normal.     Pupils: Pupils are equal, round, and reactive to light.  Cardiovascular:     Rate and Rhythm: Regular rhythm. Tachycardia present.     Pulses: Normal pulses.     Heart sounds: No murmur. No friction rub.  Pulmonary:     Effort: Pulmonary effort is normal.     Breath sounds: Normal breath sounds.  Abdominal:     General: Bowel sounds are normal.     Palpations: Abdomen is soft.     Comments: Gravid  Musculoskeletal:        General: No swelling, tenderness or deformity.     Cervical back: Normal range of motion.     Right lower leg: No edema.     Left lower leg: No edema.  Skin:    General: Skin is warm and dry.     Capillary Refill: Capillary refill takes less than 2  seconds.  Neurological:     Mental Status: She is alert and oriented to person, place, and time.  Psychiatric:        Mood and Affect: Mood normal.        Behavior: Behavior normal.     Labs and Imaging: I have personally reviewed following labs and imaging studies CBC: Recent Labs  Lab 02/22/20 0204  WBC 8.6  NEUTROABS 5.9  HGB 10.0*  HCT 29.9*  MCV 98.7  PLT 215   CMP: Recent Labs  Lab 02/22/20 0204  NA 133*  K 3.7  CL 105  CO2 21*  GLUCOSE 84  BUN 11  CREATININE 0.78  CALCIUM 8.8*   Urine analysis: Recent Labs    02/22/20 0227  COLORURINE YELLOW  APPEARANCEUR CLOUDY*  LABSPEC >1.030*  PHURINE 6.0  GLUCOSEU NEGATIVE  HGBUR NEGATIVE  BILIRUBINUR NEGATIVE  KETONESUR 15*  PROTEINUR NEGATIVE  NITRITE NEGATIVE  LEUKOCYTESUR TRACE*    Sepsis Labs:  Recent Results (from the past 240 hour(s))  Respiratory Panel by RT PCR (Flu A&B, Covid) - Nasopharyngeal Swab     Status: Abnormal   Collection Time: 02/22/20  1:15 AM   Specimen: Nasopharyngeal Swab  Result Value Ref Range Status   SARS Coronavirus 2 by RT PCR POSITIVE (A) NEGATIVE Final    Comment: RESULT CALLED TO, READ BACK BY AND VERIFIED WITH:  PERKINS TONI @ 0258 ON 02/22/2020, [email or web address].P (NOTE) SARS-CoV-2 target nucleic acids are DETECTED. SARS-CoV-2 RNA is generally detectable in upper respiratory specimens  during the acute phase of infection. Positive results are indicative of the presence of the identified virus, but do not rule out bacterial infection or co-infection with other pathogens not detected by the test. Clinical correlation with patient history and other diagnostic information is necessary to determine patient infection status. The expected result is Negative. Fact Sheet for Patients:  https://www.moore.com/ Fact Sheet for Healthcare Providers: https://www.young.biz/ This test is not yet approved or cleared by the [place] FDA and  has  been authorized for detection and/or diagnosis of SARS-CoV-2 by FDA under an Emergency Use Authorization (EUA).  This EUA will remain in effect (meaning this test ca n be used) for the duration of  the COVID-19 declaration under Section 564(b)(1) of the Act, 21 U.S.C. section 360bbb-3(b)(1), unless the authorization is terminated or revoked sooner.    Influenza A by PCR NEGATIVE NEGATIVE Final   Influenza B by PCR NEGATIVE NEGATIVE Final    Comment: (NOTE) The Xpert Xpress SARS-CoV-2/FLU/RSV assay is intended as an aid in  the diagnosis of influenza from Nasopharyngeal swab specimens and  should not be used as a sole basis for treatment. Nasal washings and  aspirates are unacceptable for Xpert Xpress SARS-CoV-2/FLU/RSV  testing. Fact Sheet for Patients: https://www.moore.com/ Fact Sheet for Healthcare Providers: https://www.young.biz/ This test is not yet approved or cleared by the [place] FDA and  has been authorized for detection and/or diagnosis of SARS-CoV-2 by  FDA under an Emergency Use Authorization (EUA). This EUA will remain  in effect (meaning this test can be used) for the duration of the  Covid-19 declaration under Section 564(b)(1) of the Act, 21  U.S.C. section 360bbb-3(b)(1), unless the authorization is  terminated or revoked. Performed at [facility], [address], [place], [place] [address]      Imaging/Diagnostic Tests: DG Chest Portable 1 View  Result Date: 02/22/2020 CLINICAL DATA:  COVID. EXAM: PORTABLE CHEST 1 VIEW COMPARISON:  10/29/2018 FINDINGS: Normal heart size and mediastinal contours. No acute infiltrate or edema. No effusion or pneumothorax. No acute osseous findings. IMPRESSION: Negative chest. Electronically Signed   By: Marnee Spring M.D.   On: 02/22/2020 04:25      Melene Plan, M.D. 02/22/2020, 6:15 AM PGY-2, [facility] FPTS Intern pager: [phone], text pages  welcome

## 2020-02-23 LAB — URINE CULTURE
Culture: 50000 — AB
Special Requests: NORMAL

## 2020-02-25 NOTE — Hospital Course (Signed)
Jasmine Thornton is a 24 y.o. female who presented to med [facility] with myalgias and was admitted for positive Covid testing and urinary tract infection in setting of pregnancy. Hong is a G1 P0 at 26+1 with medical history is significant for well-controlled asthma & HSV.   COVID 19 Infection in pregnancy, GI 26 weeks 1 day Patient presented to [facility] with myalgias.  Patient was found to have Covid positive test.  Patient's case was discussed with OB provider on call and with normal fetal monitoring, patient was cleared from obstetrics perspective.  Patient was monitored in ED and given pregnancy and concern for potential respiratory decline as well as urinalysis with leukocytes and many bacteria, she was admitted.  Vitals were notable for intermittent tachycardia. Patient was afebrile and did not require supplemental oxygen.  D-dimer was 1.41 and fibrinogen within normal limits at 427.  Later in afternoon on the day of admission, patient remained stable on room air and with clearance from obstetrics, she was discharged home.  Anemia pregnancy Patient was noted to have hemoglobin of 10 and recommended to start iron with prenatal vitamin as outpatient.  Symptomatic bacteriuria Urine analysis was notable for leukocytes and many bacteria on admission.  Urine culture was collected and grew greater than 50,000 lactobacillus.  Antibiotics were not recommended after discharge.  Patient was treated with 1 dose ceftriaxone in ED received no other antibiotics during admission.

## 2020-03-05 ENCOUNTER — Other Ambulatory Visit: Payer: Self-pay | Admitting: *Deleted

## 2020-03-05 ENCOUNTER — Encounter: Payer: Self-pay | Admitting: Advanced Practice Midwife

## 2020-03-05 DIAGNOSIS — Z3400 Encounter for supervision of normal first pregnancy, unspecified trimester: Secondary | ICD-10-CM

## 2020-03-06 ENCOUNTER — Other Ambulatory Visit: Payer: Self-pay

## 2020-03-06 ENCOUNTER — Other Ambulatory Visit: Payer: Medicaid Other

## 2020-03-06 ENCOUNTER — Encounter: Payer: Self-pay | Admitting: Nurse Practitioner

## 2020-03-06 ENCOUNTER — Ambulatory Visit (INDEPENDENT_AMBULATORY_CARE_PROVIDER_SITE_OTHER): Payer: Medicaid Other | Admitting: Nurse Practitioner

## 2020-03-06 VITALS — BP 121/77 | HR 92 | Wt 152.5 lb

## 2020-03-06 DIAGNOSIS — L299 Pruritus, unspecified: Secondary | ICD-10-CM

## 2020-03-06 DIAGNOSIS — Z3A28 28 weeks gestation of pregnancy: Secondary | ICD-10-CM

## 2020-03-06 DIAGNOSIS — N3000 Acute cystitis without hematuria: Secondary | ICD-10-CM

## 2020-03-06 DIAGNOSIS — Z23 Encounter for immunization: Secondary | ICD-10-CM

## 2020-03-06 DIAGNOSIS — B9689 Other specified bacterial agents as the cause of diseases classified elsewhere: Secondary | ICD-10-CM | POA: Diagnosis not present

## 2020-03-06 DIAGNOSIS — Z3403 Encounter for supervision of normal first pregnancy, third trimester: Secondary | ICD-10-CM | POA: Diagnosis not present

## 2020-03-06 DIAGNOSIS — Z3400 Encounter for supervision of normal first pregnancy, unspecified trimester: Secondary | ICD-10-CM

## 2020-03-06 NOTE — Progress Notes (Signed)
    Subjective:  Jasmine Thornton is a 24 y.o. G1P0 at [redacted]w[redacted]d being seen today for ongoing prenatal care.  She is currently monitored for the following issues for this low-risk pregnancy and has Supervision of low-risk first pregnancy, unspecified trimester; Asthma; Herpes genitalis in women; Genital herpes affecting pregnancy, antepartum; COVID-19; and Myalgia on their problem list.  Patient reports itching on the palms of her hands and soles of her feet.  Contractions: Not present. Vag. Bleeding: None.  Movement: Present. Denies leaking of fluid.   The following portions of the patient's history were reviewed and updated as appropriate: allergies, current medications, past family history, past medical history, past social history, past surgical history and problem list. Problem list updated.  Objective:   Vitals:   03/06/20 0908  BP: 121/77  Pulse: 92  Weight: 152 lb 8 oz (69.2 kg)    Fetal Status: Fetal Heart Rate (bpm): 153 Fundal Height: 28 cm Movement: Present     General:  Alert, oriented and cooperative. Patient is in no acute distress.  Skin: Skin is warm and dry. No rash noted.   Cardiovascular: Normal heart rate noted  Respiratory: Normal respiratory effort, no problems with respiration noted  Abdomen: Soft, gravid, appropriate for gestational age. Pain/Pressure: Present     Pelvic:  Cervical exam deferred        Extremities: Normal range of motion.  Edema: None  Mental Status: Normal mood and affect. Normal behavior. Normal judgment and thought content.   Urinalysis:      Assessment and Plan:  Pregnancy: G1P0 at [redacted]w[redacted]d  1. Supervision of low-risk first pregnancy, unspecified trimester Is signed up for waterbirth classes in July.  Advised to sign up also for childbirth and breastfeeding classes Was hospitalized for Covid on 02-22-20 but is now much better and without symptoms 28 week labs with 2 hour done today   - Tdap vaccine greater than or equal to 7yo IM  2.  Itching Will draw labs to evaluate for cholestasis  - Bile acids, total - Comprehensive metabolic panel  3. Acute cystitis without hematuria No UTI identified on urine culture although client was told she had a UTI when hospitalized for Covid.  Will check urine culture today to confirm no UTI.  - Culture, OB Urine  Preterm labor symptoms and general obstetric precautions including but not limited to vaginal bleeding, contractions, leaking of fluid and fetal movement were reviewed in detail with the patient. Please refer to After Visit Summary for other counseling recommendations.  Return in about 2 weeks (around 03/20/2020) for Virtual ROB.  Nolene Bernheim, RN, MSN, NP-BC Nurse Practitioner, [facility] for [employer], [facility] 03/06/2020 12:52 PM

## 2020-03-06 NOTE — Patient Instructions (Addendum)
Iron-Rich Diet  Iron is a mineral that helps your body to produce hemoglobin. Hemoglobin is a protein in red blood cells that carries oxygen to your body's tissues. Eating too little iron may cause you to feel weak and tired, and it can increase your risk of infection. Iron is naturally found in many foods, and many foods have iron added to them (iron-fortified foods). You may need to follow an iron-rich diet if you do not have enough iron in your body due to certain medical conditions. The amount of iron that you need each day depends on your age, your sex, and any medical conditions you have. Follow instructions from your health care provider or a diet and nutrition specialist (dietitian) about how much iron you should eat each day. What are tips for following this plan? Reading food labels  Check food labels to see how many milligrams (mg) of iron are in each serving. Cooking  Cook foods in pots and pans that are made from iron.  Take these steps to make it easier for your body to absorb iron from certain foods: ? Soak beans overnight before cooking. ? Soak whole grains overnight and drain them before using. ? Ferment flours before baking, such as by using yeast in bread dough. Meal planning  When you eat foods that contain iron, you should eat them with foods that are high in vitamin C. These include oranges, peppers, tomatoes, potatoes, and mango. Vitamin C helps your body to absorb iron. General information  Take iron supplements only as told by your health care provider. An overdose of iron can be life-threatening. If you were prescribed iron supplements, take them with orange juice or a vitamin C supplement.  When you eat iron-fortified foods or take an iron supplement, you should also eat foods that naturally contain iron, such as meat, poultry, and fish. Eating naturally iron-rich foods helps your body to absorb the iron that is added to other foods or contained in a  supplement.  Certain foods and drinks prevent your body from absorbing iron properly. Avoid eating these foods in the same meal as iron-rich foods or with iron supplements. These foods include: ? Coffee, black tea, and red wine. ? Milk, dairy products, and foods that are high in calcium. ? Beans and soybeans. ? Whole grains. What foods should I eat? Fruits Prunes. Raisins. Eat fruits high in vitamin C, such as oranges, grapefruits, and strawberries, alongside iron-rich foods. Vegetables Spinach (cooked). Green peas. Broccoli. Fermented vegetables. Eat vegetables high in vitamin C, such as leafy greens, potatoes, bell peppers, and tomatoes, alongside iron-rich foods. Grains Iron-fortified breakfast cereal. Iron-fortified whole-wheat bread. Enriched rice. Sprouted grains. Meats and other proteins Beef liver. Oysters. Beef. Shrimp. [place]. Chicken. [place]. Sardines. Chickpeas. Nuts. Tofu. Pumpkin seeds. Beverages Tomato juice. Fresh orange juice. Prune juice. Hibiscus tea. Fortified instant breakfast shakes. Sweets and desserts Blackstrap molasses. Seasonings and condiments Tahini. Fermented soy sauce. Other foods Wheat germ. The items listed above may not be a complete list of recommended foods and beverages. Contact a dietitian for more information. What foods should I avoid? Grains Whole grains. Bran cereal. Bran flour. Oats. Meats and other proteins Soybeans. Products made from soy protein. Black beans. Lentils. Mung beans. Split peas. Dairy Milk. Cream. Cheese. Yogurt. Cottage cheese. Beverages Coffee. Black tea. Red wine. Sweets and desserts Cocoa. Chocolate. Ice cream. Other foods Basil. Oregano. Large amounts of parsley. The items listed above may not be a complete list of foods and beverages to avoid.  Contact a dietitian for more information. Summary  Iron is a mineral that helps your body to produce hemoglobin. Hemoglobin is a protein in red blood cells that carries  oxygen to your body's tissues.  Iron is naturally found in many foods, and many foods have iron added to them (iron-fortified foods).  When you eat foods that contain iron, you should eat them with foods that are high in vitamin C. Vitamin C helps your body to absorb iron.  Certain foods and drinks prevent your body from absorbing iron properly, such as whole grains and dairy products. You should avoid eating these foods in the same meal as iron-rich foods or with iron supplements. This information is not intended to replace advice given to you by your health care provider. Make sure you discuss any questions you have with your health care provider. Document Revised: 10/09/2017 Document Reviewed: 09/22/2017 Elsevier Patient Education  2020 [employer].  Intrauterine Device Information An intrauterine device (IUD) is a medical device that is inserted in the uterus to prevent pregnancy. It is a small, T-shaped device that has one or two nylon strings hanging down from it. The strings hang out of the lower part of the uterus (cervix) to allow for future IUD removal. There are two types of IUDs available:  Hormone IUD. This type of IUD is made of plastic and contains the hormone progestin (synthetic progesterone). A hormone IUD may last 3-5 years.  Copper IUD. This type of IUD has copper wire wrapped around it. A copper IUD may last up to 10 years. How is an IUD inserted? An IUD is inserted through the vagina and placed into the uterus with a minor medical procedure. The exact procedure for IUD insertion may vary among health care providers and hospitals. How does an IUD work? Synthetic progesterone in a hormonal IUD prevents pregnancy by:  Thickening cervical mucus to prevent sperm from entering the uterus.  Thinning the uterine lining to prevent a fertilized egg from being implanted there. Copper in a copper IUD prevents pregnancy by making the uterus and fallopian tubes produce a fluid that  kills sperm. What are the advantages of an IUD? Advantages of either type of IUD  It is highly effective in preventing pregnancy.  It is reversible. You can become pregnant shortly after the IUD is removed.  It is low-maintenance and can stay in place for a long time.  There are no estrogen-related side effects.  It can be used when breastfeeding.  It is not associated with weight gain.  It can be inserted right after childbirth, an abortion, or a miscarriage. Advantages of a hormone IUD  If it is inserted within 7 days of your period starting, it works right after it is inserted. If the hormone IUD is inserted at any other time in your cycle, you will need to use a backup method of birth control for 7 days after insertion.  It can make menstrual periods lighter.  It can reduce menstrual cramping.  It can be used for 3-5 years. Advantages of a copper IUD  It works right after it is inserted.  It can be used as a form of emergency birth control if it is inserted within 5 days after having unprotected sex.  It does not interfere with your body's natural hormones.  It can be used for 10 years. What are the disadvantages of an IUD?  An IUD may cause irregular menstrual bleeding for a period of time after insertion.  You  may have pain during insertion and have cramping and vaginal bleeding after insertion.  An IUD may cut the uterus (uterine perforation) when it is inserted. This is rare.  An IUD may cause pelvic inflammatory disease (PID), which is an infection in the uterus and fallopian tubes. This is rare, and it usually happens during the first 20 days after the IUD is inserted.  A copper IUD can make your menstrual flow heavier and more painful. How is an IUD removed?  You will lie on your back with your knees bent and your feet in footrests (stirrups).  A device will be inserted into your vagina to spread apart the vaginal walls (speculum). This will allow your  health care provider to see the strings attached to the IUD.  Your health care provider will use a small instrument (forceps) to grasp the IUD strings and pull firmly until the IUD is removed. You may have some discomfort when the IUD is removed. Your health care provider may recommend taking over-the-counter pain relievers, such as ibuprofen, before the procedure. You may also have minor spotting for a few days after the procedure. The exact procedure for IUD removal may vary among health care providers and hospitals. Is the IUD right for me? Your health care provider will make sure you are a good candidate for an IUD and will discuss the advantages, disadvantages, and possible side effects with you. Summary  An intrauterine device (IUD) is a medical device that is inserted in the uterus to prevent pregnancy. It is a small, T-shaped device that has one or two nylon strings hanging down from it.  A hormone IUD contains the hormone progestin (synthetic progesterone). A copper IUD has copper wire wrapped around it.  Synthetic progesterone in a hormone IUD prevents pregnancy by thickening cervical mucus and thinning the walls of the uterus. Copper in a copper IUD prevents pregnancy by making the uterus and fallopian tubes produce a fluid that kills sperm.  A hormone IUD can be left in place for 3-5 years. A copper IUD can be left in place for up to 10 years.  An IUD is inserted and removed by a health care provider. You may feel some pain during insertion and removal. Your health care provider may recommend taking over-the-counter pain medicine, such as ibuprofen, before an IUD procedure. This information is not intended to replace advice given to you by your health care provider. Make sure you discuss any questions you have with your health care provider. Document Revised: 10/09/2017 Document Reviewed: 11/25/2016 Elsevier Patient Education  [place].

## 2020-03-07 LAB — COMPREHENSIVE METABOLIC PANEL
ALT: 84 IU/L — ABNORMAL HIGH (ref 0–32)
AST: 49 IU/L — ABNORMAL HIGH (ref 0–40)
Albumin/Globulin Ratio: 1.4 (ref 1.2–2.2)
Albumin: 3.6 g/dL — ABNORMAL LOW (ref 3.9–5.0)
Alkaline Phosphatase: 69 IU/L (ref 39–117)
BUN/Creatinine Ratio: 13 (ref 9–23)
BUN: 9 mg/dL (ref 6–20)
Bilirubin Total: 0.5 mg/dL (ref 0.0–1.2)
CO2: 18 mmol/L — ABNORMAL LOW (ref 20–29)
Calcium: 8.6 mg/dL — ABNORMAL LOW (ref 8.7–10.2)
Chloride: 104 mmol/L (ref 96–106)
Creatinine, Ser: 0.71 mg/dL (ref 0.57–1.00)
GFR calc Af Amer: 141 mL/min/{1.73_m2} (ref >59)
GFR calc non Af Amer: 122 mL/min/{1.73_m2} (ref >59)
Globulin, Total: 2.6 g/dL (ref 1.5–4.5)
Glucose: 130 mg/dL — ABNORMAL HIGH (ref 65–99)
Potassium: 3.3 mmol/L — ABNORMAL LOW (ref 3.5–5.2)
Sodium: 135 mmol/L (ref 134–144)
Total Protein: 6.2 g/dL (ref 6.0–8.5)

## 2020-03-07 LAB — CBC
Hematocrit: 31.9 % — ABNORMAL LOW (ref 34.0–46.6)
Hemoglobin: 10.6 g/dL — ABNORMAL LOW (ref 11.1–15.9)
MCH: 32.3 pg (ref 26.6–33.0)
MCHC: 33.2 g/dL (ref 31.5–35.7)
MCV: 97 fL (ref 79–97)
Platelets: 247 10*3/uL (ref 150–450)
RBC: 3.28 x10E6/uL — ABNORMAL LOW (ref 3.77–5.28)
RDW: 12.1 % (ref 11.7–15.4)
WBC: 8.9 10*3/uL (ref 3.4–10.8)

## 2020-03-07 LAB — BILE ACIDS, TOTAL: Bile Acids Total: 2.4 umol/L (ref 0.0–10.0)

## 2020-03-07 LAB — RPR: RPR Ser Ql: NONREACTIVE

## 2020-03-07 LAB — GLUCOSE TOLERANCE, 2 HOURS W/ 1HR
Glucose, 1 hour: 131 mg/dL (ref 65–179)
Glucose, 2 hour: 82 mg/dL (ref 65–152)
Glucose, Fasting: 59 mg/dL — ABNORMAL LOW (ref 65–91)

## 2020-03-07 LAB — HIV ANTIBODY (ROUTINE TESTING W REFLEX): HIV Screen 4th Generation wRfx: NONREACTIVE

## 2020-03-08 ENCOUNTER — Other Ambulatory Visit: Payer: Self-pay | Admitting: Nurse Practitioner

## 2020-03-08 DIAGNOSIS — O26613 Liver and biliary tract disorders in pregnancy, third trimester: Secondary | ICD-10-CM

## 2020-03-08 DIAGNOSIS — K831 Obstruction of bile duct: Secondary | ICD-10-CM

## 2020-03-08 DIAGNOSIS — L299 Pruritus, unspecified: Secondary | ICD-10-CM | POA: Insufficient documentation

## 2020-03-08 MED ORDER — HYDROXYZINE HCL 25 MG PO TABS: 25.0000 mg | ORAL_TABLET | Freq: Three times a day (TID) | ORAL | 2 refills | Status: DC | PRN

## 2020-03-08 MED ORDER — URSODIOL 500 MG PO TABS: 500.0000 mg | ORAL_TABLET | Freq: Two times a day (BID) | ORAL | 3 refills | Status: DC

## 2020-03-08 NOTE — Progress Notes (Signed)
Hx of itching on palms and soles of feet at 28 weeks.   Bile acids 2.4 ALT and AST elevated. Consult with Dr. Vevelyn Pat Plan of care: Will prescribe atarax 25 mg q8h PRN and Ursodiol 500 mg PO BID Medications sent to client's pharmacy and MyChart message sent to client.  Nolene Bernheim, RN, MSN, NP-BC Nurse Practitioner, [facility] for [employer], [facility] 03/08/2020 2:34 PM

## 2020-03-15 ENCOUNTER — Telehealth (INDEPENDENT_AMBULATORY_CARE_PROVIDER_SITE_OTHER): Payer: Medicaid Other | Admitting: Lactation Services

## 2020-03-15 DIAGNOSIS — K831 Obstruction of bile duct: Secondary | ICD-10-CM

## 2020-03-15 DIAGNOSIS — O26613 Liver and biliary tract disorders in pregnancy, third trimester: Secondary | ICD-10-CM

## 2020-03-15 DIAGNOSIS — O26643 Intrahepatic cholestasis of pregnancy, third trimester: Secondary | ICD-10-CM

## 2020-03-15 NOTE — Telephone Encounter (Signed)
Called patient to discuss lab results with patient. She reports she does have any questions at this time.

## 2020-03-15 NOTE — Telephone Encounter (Signed)
-----   Message from Currie Paris, NP sent at 03/08/2020  2:42 PM EDT ----- Cholestasis -  order made for referral to MFM for weekly BPP  Sent MyChart message to client but may want to call her and see if she has any questions.

## 2020-03-21 ENCOUNTER — Encounter: Payer: Self-pay | Admitting: Nurse Practitioner

## 2020-03-21 ENCOUNTER — Telehealth (INDEPENDENT_AMBULATORY_CARE_PROVIDER_SITE_OTHER): Payer: Medicaid Other | Admitting: Women's Health

## 2020-03-21 ENCOUNTER — Other Ambulatory Visit: Payer: Self-pay

## 2020-03-21 VITALS — BP 107/76 | HR 86

## 2020-03-21 DIAGNOSIS — O26613 Liver and biliary tract disorders in pregnancy, third trimester: Secondary | ICD-10-CM | POA: Diagnosis not present

## 2020-03-21 DIAGNOSIS — J4520 Mild intermittent asthma, uncomplicated: Secondary | ICD-10-CM

## 2020-03-21 DIAGNOSIS — O98313 Other infections with a predominantly sexual mode of transmission complicating pregnancy, third trimester: Secondary | ICD-10-CM

## 2020-03-21 DIAGNOSIS — K831 Obstruction of bile duct: Secondary | ICD-10-CM

## 2020-03-21 DIAGNOSIS — O99513 Diseases of the respiratory system complicating pregnancy, third trimester: Secondary | ICD-10-CM | POA: Diagnosis not present

## 2020-03-21 DIAGNOSIS — A6009 Herpesviral infection of other urogenital tract: Secondary | ICD-10-CM

## 2020-03-21 DIAGNOSIS — Z3400 Encounter for supervision of normal first pregnancy, unspecified trimester: Secondary | ICD-10-CM

## 2020-03-21 DIAGNOSIS — Z3A30 30 weeks gestation of pregnancy: Secondary | ICD-10-CM

## 2020-03-21 NOTE — Progress Notes (Signed)
I connected with  Jasmine Thornton on 03/21/20 at 11:15 AM EDT by telephone and verified that I am speaking with the correct person using two identifiers.   I discussed the limitations, risks, security and privacy concerns of performing an evaluation and management service by telephone and the availability of in person appointments. I also discussed with the patient that there may be a patient responsible charge related to this service. The patient expressed understanding and agreed to proceed.  Janene Madeira Jove Beyl, CMA 03/21/2020  11:17 AM

## 2020-03-21 NOTE — Addendum Note (Signed)
Addended by: Donia Ast E on: 03/21/2020 11:44 AM   Modules accepted: Orders

## 2020-03-21 NOTE — Progress Notes (Signed)
I connected with Jasmine Thornton on 03/21/20 at 11:15 AM EDT by: MyChart and verified that I am speaking with the correct person using two identifiers.  Patient is located at home and provider is located at [facility].     The purpose of this virtual visit is to provide medical care while limiting exposure to the novel coronavirus. I discussed the limitations, risks, security and privacy concerns of performing an evaluation and management service by MyChart and the availability of in person appointments. I also discussed with the patient that there may be a patient responsible charge related to this service. By engaging in this virtual visit, you consent to the provision of healthcare.  Additionally, you authorize for your insurance to be billed for the services provided during this visit.  The patient expressed understanding and agreed to proceed.  The following staff members participated in the virtual visit:  Donia Ast, NP    PRENATAL VISIT NOTE  Subjective:  Jasmine Thornton is a 24 y.o. G1P0 at [redacted]w[redacted]d  for phone visit for ongoing prenatal care.  She is currently monitored for the following issues for this high-risk pregnancy and has Supervision of low-risk first pregnancy, unspecified trimester; Asthma; Herpes genitalis in women; Genital herpes affecting pregnancy, antepartum; COVID-19; Myalgia; and Cholestasis during pregnancy in third trimester on their problem list.  Patient reports no complaints.  Contractions: Not present. Vag. Bleeding: None.  Movement: Present. Denies leaking of fluid.   The following portions of the patient's history were reviewed and updated as appropriate: allergies, current medications, past family history, past medical history, past social history, past surgical history and problem list.   Objective:   Vitals:   03/21/20 1116  BP: 107/76  Pulse: 86   Self-Obtained  Fetal Status:     Movement: Present     Assessment and Plan:  Pregnancy: G1P0 at [redacted]w[redacted]d  1.  Supervision of low-risk first pregnancy, unspecified trimester -no concerns -waterbirth class scheduled for 03/26/2020  2. Mild intermittent asthma without complication -pt has inhaler at home, last used "a while ago"  3. Genital herpes affecting pregnancy, antepartum -start suppressive medication at 35weeks or sooner depending on recommendation from MFM for delivery date for cholestasis  4. Cholestasis during pregnancy in third trimester -will return in one week for blood work needed after 03/08/2020 visit for hepatitis panel and LFTs -per problem list, patient needs weekly testing with MFM for cholestasis starting at 28 weeks per guidelines, but has not been scheduled, will schedule today  Preterm labor symptoms and general obstetric precautions including but not limited to vaginal bleeding, contractions, leaking of fluid and fetal movement were reviewed in detail with the patient.  Return in about 1 week (around 03/28/2020) for in-person [facility] for waterbirth/labs, needs BPP with MFM ASAP.  No future appointments.   Time spent on virtual visit: 11 minutes  Marylen Ponto, NP

## 2020-03-21 NOTE — Addendum Note (Signed)
Addended by: Marylen Ponto on: 03/21/2020 11:34 AM   Modules accepted: Orders

## 2020-03-21 NOTE — Patient Instructions (Addendum)
Maternity Assessment Unit (MAU)  The Maternity Assessment Unit (MAU) is located at the [facility] at [facility]. The address is: [address], [place], [place], [place] [address]. Please see map below for additional directions.    The Maternity Assessment Unit is designed to help you during your pregnancy, and for up to 6 weeks after delivery, with any pregnancy- or postpartum-related emergencies, if you think you are in labor, or if your water has broken. For example, if you experience nausea and vomiting, vaginal bleeding, severe abdominal or pelvic pain, elevated blood pressure or other problems related to your pregnancy or postpartum time, please come to the Maternity Assessment Unit for assistance.        Cholestasis of Pregnancy  Cholestasis refers to any condition that causes the flow of digestive fluid (bile) produced by the liver to slow down or stop. Cholestasis of pregnancy is most common toward the end of pregnancy (thirdtrimester), but it can occur any time during pregnancy. The condition often goes away soon after giving birth. Cholestasis may be uncomfortable, but it is usually harmless to you. However, it can be harmful to your baby. Cholestasis may increase the risk of:  Your baby being born too early (preterm delivery).  Your baby having a slow heart rate and lack of oxygen during delivery (fetal distress).  Losing your baby before delivery (stillbirth). What are the causes? The exact cause of this condition is not known, but it may be related to:  Pregnancy hormones. The gallbladder normally holds bile until you need it to help digest fat in your diet. Pregnancy hormones may cause the flow of bile to slow down and back up into your liver. Bile may then get into your bloodstream and cause cholestasis symptoms.  Changes in your genes (genetic mutations). Specifically, genes that affect how the liver releases bile. What increases the  risk? You are more likely to develop this condition if:  You had cholestasis during a previous pregnancy.  You have a family history of cholestasis.  You have liver problems.  You are having multiple babies, such as twins or triplets. What are the signs or symptoms? The most common symptom of this condition is intense itching (pruritus), especially on the palms of your hands and soles of your feet. The itching can spread to the rest of your body and is often worse at night. You will not usually have a rash. Other symptoms may include:  Feeling tired.  Pain in your upper right abdomen.  Dark-colored urine.  Light-colored stools.  Poor appetite.  Yellowish discoloration of your skin and the whites of your eyes (jaundice). How is this diagnosed? This condition is diagnosed based on:  Your medical history.  A physical exam.  Blood tests. If you have an inherited risk for developing this condition, you may also have genetic testing. How is this treated? The goal of treatment is to make you comfortable and keep your baby safe. Your health care provider may:  Prescribe medicine to reduce bile acid in your bloodstream, relieve symptoms, and help keep your baby safe.  Give you vitamin K before delivery to prevent excessive bleeding.  Check your baby frequently (fetal monitoring).  Perform regular blood tests to check your bile levels and liver function until your baby is delivered.  Recommend starting (inducing) your labor and delivery by week 36 or 37 of pregnancy, or as soon as your baby's lungs have developed enough. Follow these instructions at home:  Take over-the-counter and prescription medicines only as told by your health care provider.  Take cool baths to soothe itchy skin.  Wear comfortable, loose-fitting, cotton clothing to reduce itching.  Keep your fingernails short to prevent skin irritation from scratching.  Keep all follow-up visits and prenatal visits  as told by your health care provider. This is important. Contact a health care provider if:  Your symptoms get worse, even with treatment.  You develop pain in your right side.  You have unusual swelling in your abdomen, feet, ankles, or legs.  You have a fever.  You are more thirsty than usual. Get help right away if:  You go into early labor.  You have a headache that does not go away or causes changes in vision.  You have nausea or you vomit.  You have severe pain in your abdomen or shoulders.  You have shortness of breath. Summary  Cholestasis of pregnancy is most common toward the end of pregnancy (thirdtrimester), but it can occur any time during your pregnancy.  The condition often goes away soon after your baby is born.  The most common symptom of cholestasis of pregnancy is intense itching (pruritus), especially on the palms of your hands and soles of your feet.  This condition may be treated with medicine, frequent monitoring, or starting (inducing) labor and delivery by week 36 or 37 of pregnancy. This information is not intended to replace advice given to you by your health care provider. Make sure you discuss any questions you have with your health care provider. Document Revised: 02/17/2019 Document Reviewed: 10/11/2016 Elsevier Patient Education  2020 [employer]         Preterm Labor and Birth Information  The normal length of a pregnancy is 39-41 weeks. Preterm labor is when labor starts before 37 completed weeks of pregnancy. What are the risk factors for preterm labor? Preterm labor is more likely to occur in women who:  Have certain infections during pregnancy such as a bladder infection, sexually transmitted infection, or infection inside the uterus (chorioamnionitis).  Have a shorter-than-normal cervix.  Have gone into preterm labor before.  Have had surgery on their cervix.  Are younger than age 22 or older than age 22.  Are African  American.  Are pregnant with twins or multiple babies (multiple gestation).  Take street drugs or smoke while pregnant.  Do not gain enough weight while pregnant.  Became pregnant shortly after having been pregnant. What are the symptoms of preterm labor? Symptoms of preterm labor include:  Cramps similar to those that can happen during a menstrual period. The cramps may happen with diarrhea.  Pain in the abdomen or lower back.  Regular uterine contractions that may feel like tightening of the abdomen.  A feeling of increased pressure in the pelvis.  Increased watery or bloody mucus discharge from the vagina.  Water breaking (ruptured amniotic sac). Why is it important to recognize signs of preterm labor? It is important to recognize signs of preterm labor because babies who are born prematurely may not be fully developed. This can put them at an increased risk for:  Long-term (chronic) heart and lung problems.  Difficulty immediately after birth with regulating body systems, including blood sugar, body temperature, heart rate, and breathing rate.  Bleeding in the brain.  Cerebral palsy.  Learning difficulties.  Death. These risks are highest for babies who are born before 34 weeks of pregnancy. How is preterm labor treated? Treatment depends on the length of  your pregnancy, your condition, and the health of your baby. It may involve:  Having a stitch (suture) placed in your cervix to prevent your cervix from opening too early (cerclage).  Taking or being given medicines, such as: ? Hormone medicines. These may be given early in pregnancy to help support the pregnancy. ? Medicine to stop contractions. ? Medicines to help mature the babys lungs. These may be prescribed if the risk of delivery is high. ? Medicines to prevent your baby from developing cerebral palsy. If the labor happens before 34 weeks of pregnancy, you may need to stay in the hospital. What should I  do if I think I am in preterm labor? If you think that you are going into preterm labor, call your health care provider right away. How can I prevent preterm labor in future pregnancies? To increase your chance of having a full-term pregnancy:  Do not use any tobacco products, such as cigarettes, chewing tobacco, and e-cigarettes. If you need help quitting, ask your health care provider.  Do not use street drugs or medicines that have not been prescribed to you during your pregnancy.  Talk with your health care provider before taking any herbal supplements, even if you have been taking them regularly.  Make sure you gain a healthy amount of weight during your pregnancy.  Watch for infection. If you think that you might have an infection, get it checked right away.  Make sure to tell your health care provider if you have gone into preterm labor before. This information is not intended to replace advice given to you by your health care provider. Make sure you discuss any questions you have with your health care provider. Document Revised: 02/18/2019 Document Reviewed: 03/19/2016 Elsevier Patient Education  [employer].     Pregnancy and Genital Herpes  Genital herpes is an STI (sexually transmitted infection) that is caused by the herpes simplex virus (HSV). HSV can cause an outbreak of itching, blisters, and sores (ulcers) around the genitals and rectum. Even when the outbreak goes away, the virus stays in the body. If you are pregnant, you can pass HSV to your baby. If you become infected with HSV for the first time while you are pregnant, the virus can cause serious problems for your baby. If you had HSV before your pregnancy, the virus may not affect your baby as seriously. Babies that are infected with HSV are at risk for developing inflammation of the brain (encephalitis), damage to organs, and problems with development. How does this affect me? Your type of delivery may be  affected. You may be able to have a vaginal delivery if you have no evidence of an outbreak when you go into labor. However, your baby may need to be delivered by C-section (cesarean delivery) if you have:  An active, recurrent, or new herpes outbreak at the time of delivery. This is because the virus can pass to your baby through an infected birth canal. This can cause severe problems for your baby.  Any symptoms of infection in the areas around the genitals such as pain, burning, and itching, even if you do not have any ulcers in the birth canal. After delivery, you can breastfeed your baby. The virus will not be present in breast milk. While caring for your baby, you will need to take steps to avoid passing the virus on to your baby. How does this affect my baby? If the virus passes to your baby, it can cause serious  problems. The virus can be passed to your baby:  Before delivery. The virus can be passed to your unborn baby through the placenta. This is more likely to happen if you get herpes for the first time in the first 3 months of pregnancy (first trimester). This may cause your baby to have a congenital disability.  During delivery. This is more likely to happen if you become infected for the first time late in your pregnancy.  After delivery. Your baby can get a herpes infection if you touch active ulcers and then touch your baby without washing your hands. The virus is less likely to pass to your baby if you had herpes before you became pregnant. This is because antibodies against the virus develop over a period of time. These antibodies help to protect the baby. How is this treated? This condition can be treated with medicines during pregnancy that are safe for you and your baby. These medicines can help to reduce symptoms, shorten an outbreak, and prevent another outbreak of the infection. If the infection happened before you became pregnant, you may need to take medicine late in your  pregnancy to help to prevent a breakout at the time of delivery. Follow these instructions at home: To avoid passing the virus to your baby:  Wash your hands with soap and water often and before touching your baby.  If you have an outbreak, keep the area clean and covered.  If ulcers are present on your breast, do not breastfeed from the affected breast. Contact a health care provider if:  You have a rash, blisters, or ulcers in the area around your genitals or rectum.  You have burning, itching, or pain in the area around your genitals or rectum.  You have trouble urinating. Summary  Genital herpes is an STI (sexually transmitted infection) that is caused by the herpes simplex virus (HSV). If you are pregnant, you can pass the virus to your baby.  Even when the outbreak goes away, the virus stays in your body.  Genital herpes can be passed to your unborn or newborn baby and cause serious problems.  This condition can be treated with medicines during pregnancy that are safe for you and your baby. Medicines can treat your symptoms, shorten the length of an outbreak, and prevent another outbreak of the infection.  If you have signs or symptoms of a herpes outbreak when you go into labor, your health care provider may recommend a C-section (cesarean delivery) to lower the risk of passing the virus to your baby. This information is not intended to replace advice given to you by your health care provider. Make sure you discuss any questions you have with your health care provider. Document Revised: 02/18/2019 Document Reviewed: 01/27/2019 Elsevier Patient Education  2020 [employer].     Preterm Labor and Birth Information  The normal length of a pregnancy is 39-41 weeks. Preterm labor is when labor starts before 37 completed weeks of pregnancy. What are the risk factors for preterm labor? Preterm labor is more likely to occur in women who:  Have certain infections during  pregnancy such as a bladder infection, sexually transmitted infection, or infection inside the uterus (chorioamnionitis).  Have a shorter-than-normal cervix.  Have gone into preterm labor before.  Have had surgery on their cervix.  Are younger than age 16 or older than age 61.  Are African American.  Are pregnant with twins or multiple babies (multiple gestation).  Take street drugs or smoke while  pregnant.  Do not gain enough weight while pregnant.  Became pregnant shortly after having been pregnant. What are the symptoms of preterm labor? Symptoms of preterm labor include:  Cramps similar to those that can happen during a menstrual period. The cramps may happen with diarrhea.  Pain in the abdomen or lower back.  Regular uterine contractions that may feel like tightening of the abdomen.  A feeling of increased pressure in the pelvis.  Increased watery or bloody mucus discharge from the vagina.  Water breaking (ruptured amniotic sac). Why is it important to recognize signs of preterm labor? It is important to recognize signs of preterm labor because babies who are born prematurely may not be fully developed. This can put them at an increased risk for:  Long-term (chronic) heart and lung problems.  Difficulty immediately after birth with regulating body systems, including blood sugar, body temperature, heart rate, and breathing rate.  Bleeding in the brain.  Cerebral palsy.  Learning difficulties.  Death. These risks are highest for babies who are born before 34 weeks of pregnancy. How is preterm labor treated? Treatment depends on the length of your pregnancy, your condition, and the health of your baby. It may involve:  Having a stitch (suture) placed in your cervix to prevent your cervix from opening too early (cerclage).  Taking or being given medicines, such as: ? Hormone medicines. These may be given early in pregnancy to help support the  pregnancy. ? Medicine to stop contractions. ? Medicines to help mature the babys lungs. These may be prescribed if the risk of delivery is high. ? Medicines to prevent your baby from developing cerebral palsy. If the labor happens before 34 weeks of pregnancy, you may need to stay in the hospital. What should I do if I think I am in preterm labor? If you think that you are going into preterm labor, call your health care provider right away. How can I prevent preterm labor in future pregnancies? To increase your chance of having a full-term pregnancy:  Do not use any tobacco products, such as cigarettes, chewing tobacco, and e-cigarettes. If you need help quitting, ask your health care provider.  Do not use street drugs or medicines that have not been prescribed to you during your pregnancy.  Talk with your health care provider before taking any herbal supplements, even if you have been taking them regularly.  Make sure you gain a healthy amount of weight during your pregnancy.  Watch for infection. If you think that you might have an infection, get it checked right away.  Make sure to tell your health care provider if you have gone into preterm labor before. This information is not intended to replace advice given to you by your health care provider. Make sure you discuss any questions you have with your health care provider. Document Revised: 02/18/2019 Document Reviewed: 03/19/2016 Elsevier Patient Education  2020 [employer].

## 2020-03-22 ENCOUNTER — Other Ambulatory Visit: Payer: Self-pay

## 2020-03-22 ENCOUNTER — Other Ambulatory Visit: Payer: Self-pay | Admitting: Lactation Services

## 2020-03-22 ENCOUNTER — Ambulatory Visit (INDEPENDENT_AMBULATORY_CARE_PROVIDER_SITE_OTHER): Payer: Medicaid Other | Admitting: *Deleted

## 2020-03-22 ENCOUNTER — Telehealth: Payer: Self-pay | Admitting: *Deleted

## 2020-03-22 ENCOUNTER — Ambulatory Visit: Payer: Self-pay

## 2020-03-22 VITALS — BP 110/66 | HR 95

## 2020-03-22 DIAGNOSIS — Z3A30 30 weeks gestation of pregnancy: Secondary | ICD-10-CM | POA: Diagnosis not present

## 2020-03-22 DIAGNOSIS — O26613 Liver and biliary tract disorders in pregnancy, third trimester: Secondary | ICD-10-CM

## 2020-03-22 DIAGNOSIS — K831 Obstruction of bile duct: Secondary | ICD-10-CM

## 2020-03-22 NOTE — Progress Notes (Signed)

## 2020-03-22 NOTE — Progress Notes (Signed)
Opened in error

## 2020-03-22 NOTE — Telephone Encounter (Signed)
Called pt to see how she is feeling since last visit on 4/29. She stated that she is taking the medication prescribed (Ursodiol) and she is having less itching. We also discussed recommended plan of care to include lab tests this week as well as fetal testing due to elevated liver enzymes. Pt voiced understanding and agreed to appt today @ 3:15 pm.

## 2020-03-23 LAB — COMPREHENSIVE METABOLIC PANEL
ALT: 112 IU/L — ABNORMAL HIGH (ref 0–32)
AST: 67 IU/L — ABNORMAL HIGH (ref 0–40)
Albumin/Globulin Ratio: 1.1 — ABNORMAL LOW (ref 1.2–2.2)
Albumin: 3.5 g/dL — ABNORMAL LOW (ref 3.9–5.0)
Alkaline Phosphatase: 81 IU/L (ref 39–117)
BUN/Creatinine Ratio: 14 (ref 9–23)
BUN: 10 mg/dL (ref 6–20)
Bilirubin Total: 0.5 mg/dL (ref 0.0–1.2)
CO2: 19 mmol/L — ABNORMAL LOW (ref 20–29)
Calcium: 8.9 mg/dL (ref 8.7–10.2)
Chloride: 105 mmol/L (ref 96–106)
Creatinine, Ser: 0.73 mg/dL (ref 0.57–1.00)
GFR calc Af Amer: 136 mL/min/{1.73_m2} (ref >59)
GFR calc non Af Amer: 118 mL/min/{1.73_m2} (ref >59)
Globulin, Total: 3.2 g/dL (ref 1.5–4.5)
Glucose: 61 mg/dL — ABNORMAL LOW (ref 65–99)
Potassium: 3.9 mmol/L (ref 3.5–5.2)
Sodium: 136 mmol/L (ref 134–144)
Total Protein: 6.7 g/dL (ref 6.0–8.5)

## 2020-03-23 LAB — BILE ACIDS, TOTAL: Bile Acids Total: 7.5 umol/L (ref 0.0–10.0)

## 2020-03-23 LAB — HEPATITIS PANEL, ACUTE
Hep A IgM: NEGATIVE
Hep B C IgM: NEGATIVE
Hep C Virus Ab: <0.1 s/co ratio (ref 0.0–0.9)
Hepatitis B Surface Ag: NEGATIVE

## 2020-03-24 ENCOUNTER — Encounter: Payer: Self-pay | Admitting: Obstetrics & Gynecology

## 2020-03-24 DIAGNOSIS — O26649 Intrahepatic cholestasis of pregnancy, unspecified trimester: Secondary | ICD-10-CM | POA: Insufficient documentation

## 2020-03-24 DIAGNOSIS — O26619 Liver and biliary tract disorders in pregnancy, unspecified trimester: Secondary | ICD-10-CM | POA: Insufficient documentation

## 2020-03-24 NOTE — Progress Notes (Signed)
No other etiology for her elevated LFTS seen. Increased bile acids and LFTs. Will continue to treat as cholestasis.  Ensure patient is taking her Ursodiol as directed.  Growth scan needed soon and continue weekly BPPs as per protocol. Delivery indicated at 37 weeks.  Please call to inform patient of results and recommendations.    Jaynie Collins, MD, FACOG Obstetrician & Gynecologist, [facility] for [employer], [facility]

## 2020-03-27 ENCOUNTER — Other Ambulatory Visit: Payer: Self-pay | Admitting: Nurse Practitioner

## 2020-03-27 DIAGNOSIS — O26649 Intrahepatic cholestasis of pregnancy, unspecified trimester: Secondary | ICD-10-CM

## 2020-03-27 DIAGNOSIS — K831 Obstruction of bile duct: Secondary | ICD-10-CM

## 2020-03-27 NOTE — Progress Notes (Signed)
Reviewed message from Dr. Macon Large.  Updated problem list and ordered growth scan and weekly BPPs in office.  Nolene Bernheim, RN, MSN, NP-BC Nurse Practitioner, [facility] for [employer], [facility] 03/27/2020 12:04 PM

## 2020-03-28 ENCOUNTER — Telehealth (INDEPENDENT_AMBULATORY_CARE_PROVIDER_SITE_OTHER): Payer: Medicaid Other | Admitting: Lactation Services

## 2020-03-28 DIAGNOSIS — O26619 Liver and biliary tract disorders in pregnancy, unspecified trimester: Secondary | ICD-10-CM

## 2020-03-28 DIAGNOSIS — K831 Obstruction of bile duct: Secondary | ICD-10-CM

## 2020-03-28 NOTE — Telephone Encounter (Signed)
-----   Message from Currie Paris, NP sent at 03/27/2020 12:26 PM EDT ----- Diagnosis of cholestasis.  Need to discuss over the phone with patient.  Orders done in the chart - to be scheduled in the office with Diane for BPP weekly and growth scan with MFM.  Needs to be taking Ursodial prescribed.  Thank you

## 2020-03-28 NOTE — Addendum Note (Signed)
Addended by: Jill Side on: 03/28/2020 11:28 AM   Modules accepted: Orders

## 2020-03-28 NOTE — Telephone Encounter (Signed)
Called patient to inform her of diagnosis of Cholestasis and need to begin Ursidiol BID. Patient was informed that the medication has been called in to her Pharmacy.   Patient was informed that with this diagnosis she will have her baby at 37 weeks. Patient voiced she is excited to meet her baby.   Patient with no questions or concerns and will call as needed.

## 2020-03-29 ENCOUNTER — Ambulatory Visit (INDEPENDENT_AMBULATORY_CARE_PROVIDER_SITE_OTHER): Payer: Medicaid Other | Admitting: Obstetrics and Gynecology

## 2020-03-29 ENCOUNTER — Other Ambulatory Visit: Payer: Self-pay

## 2020-03-29 ENCOUNTER — Ambulatory Visit (INDEPENDENT_AMBULATORY_CARE_PROVIDER_SITE_OTHER): Payer: Medicaid Other

## 2020-03-29 ENCOUNTER — Ambulatory Visit: Payer: Medicaid Other | Admitting: *Deleted

## 2020-03-29 VITALS — BP 110/73 | HR 112 | Wt 156.0 lb

## 2020-03-29 DIAGNOSIS — O98313 Other infections with a predominantly sexual mode of transmission complicating pregnancy, third trimester: Secondary | ICD-10-CM

## 2020-03-29 DIAGNOSIS — O26619 Liver and biliary tract disorders in pregnancy, unspecified trimester: Secondary | ICD-10-CM

## 2020-03-29 DIAGNOSIS — A6009 Herpesviral infection of other urogenital tract: Secondary | ICD-10-CM

## 2020-03-29 DIAGNOSIS — K831 Obstruction of bile duct: Secondary | ICD-10-CM

## 2020-03-29 DIAGNOSIS — Z3A31 31 weeks gestation of pregnancy: Secondary | ICD-10-CM

## 2020-03-29 DIAGNOSIS — O26613 Liver and biliary tract disorders in pregnancy, third trimester: Secondary | ICD-10-CM

## 2020-03-29 DIAGNOSIS — Z3400 Encounter for supervision of normal first pregnancy, unspecified trimester: Secondary | ICD-10-CM

## 2020-03-29 MED ORDER — VALACYCLOVIR HCL 500 MG PO TABS: 500.0000 mg | ORAL_TABLET | Freq: Two times a day (BID) | ORAL | 1 refills | Status: DC

## 2020-03-29 NOTE — Progress Notes (Signed)
   PRENATAL VISIT NOTE  Subjective:  Jasmine Thornton is a 24 y.o. G1P0 at [redacted]w[redacted]d being seen today for ongoing prenatal care.  She is currently monitored for the following issues for this low-risk pregnancy and has Supervision of low-risk first pregnancy, unspecified trimester; Asthma; Herpes genitalis in women; Genital herpes affecting pregnancy, antepartum; COVID-19; Myalgia; Pruritus of pregnancy in third trimester; and Intrahepatic cholestasis of pregnancy, antepartum on their problem list.  Patient reports no complaints.  Contractions: Not present. Vag. Bleeding: None.  Movement: Present. Denies leaking of fluid.   The following portions of the patient's history were reviewed and updated as appropriate: allergies, current medications, past family history, past medical history, past social history, past surgical history and problem list.   Objective:   Vitals:   03/29/20 0842  BP: 110/73  Pulse: (!) 112  Weight: 156 lb (70.8 kg)    Fetal Status: Fetal Heart Rate (bpm): 156 Fundal Height: 31 cm Movement: Present     General:  Alert, oriented and cooperative. Patient is in no acute distress.  Skin: Skin is warm and dry. No rash noted.   Cardiovascular: Normal heart rate noted  Respiratory: Normal respiratory effort, no problems with respiration noted  Abdomen: Soft, gravid, appropriate for gestational age.  Pain/Pressure: Present     Pelvic: Cervical exam deferred        Extremities: Normal range of motion.  Edema: None  Mental Status: Normal mood and affect. Normal behavior. Normal judgment and thought content.   Assessment and Plan:  Pregnancy: G1P0 at [redacted]w[redacted]d   1. Supervision of low-risk first pregnancy, unspecified trimester   Wants to do a water birth. Class is complete. Will meet with CNM to sign consent.    2. Herpes genitalis in women  Rx: Valtrex; start at 36 weeks   3. Intrahepatic cholestasis of pregnancy, antepartum  Continue ursodiol  GTT 2 hours normal.    Hepatitis panel normal, last bile acids 7.5 Per Dr. Macon Large patient to have antenatal testing starting at 32 weeks.   Preterm labor symptoms and general obstetric precautions including but not limited to vaginal bleeding, contractions, leaking of fluid and fetal movement were reviewed in detail with the patient. Please refer to After Visit Summary for other counseling recommendations.   Return in about 1 week (around 04/05/2020), or Weekly NST/BPP; Needs to see CNM for water birth- Ok to schedule in 2-3 weeks for this visit..  Future Appointments  Date Time Provider Department Center  04/05/2020  1:15 PM [facility] NST [facility]  04/05/2020  2:00 PM [facility] NURSE [facility]  04/05/2020  2:00 PM [facility] US1 [facility]  04/05/2020  3:15 PM Federico Flake, MD [facility]    Venia Carbon, NP

## 2020-04-02 NOTE — Progress Notes (Signed)
NST:  Baseline: 140 bpm, Variability: Good {> 6 bpm), Accelerations: Reactive and Decelerations: Absent   

## 2020-04-05 ENCOUNTER — Ambulatory Visit: Payer: Medicaid Other | Admitting: *Deleted

## 2020-04-05 ENCOUNTER — Ambulatory Visit (INDEPENDENT_AMBULATORY_CARE_PROVIDER_SITE_OTHER): Payer: Medicaid Other | Admitting: *Deleted

## 2020-04-05 ENCOUNTER — Ambulatory Visit (INDEPENDENT_AMBULATORY_CARE_PROVIDER_SITE_OTHER): Payer: Medicaid Other | Admitting: Family Medicine

## 2020-04-05 ENCOUNTER — Other Ambulatory Visit: Payer: Self-pay

## 2020-04-05 ENCOUNTER — Encounter: Payer: Self-pay | Admitting: Family Medicine

## 2020-04-05 ENCOUNTER — Ambulatory Visit: Payer: Medicaid Other | Attending: Obstetrics and Gynecology

## 2020-04-05 VITALS — BP 117/81 | HR 94 | Wt 154.8 lb

## 2020-04-05 DIAGNOSIS — Z3A32 32 weeks gestation of pregnancy: Secondary | ICD-10-CM

## 2020-04-05 DIAGNOSIS — Z3400 Encounter for supervision of normal first pregnancy, unspecified trimester: Secondary | ICD-10-CM | POA: Insufficient documentation

## 2020-04-05 DIAGNOSIS — O26619 Liver and biliary tract disorders in pregnancy, unspecified trimester: Secondary | ICD-10-CM

## 2020-04-05 DIAGNOSIS — O26613 Liver and biliary tract disorders in pregnancy, third trimester: Secondary | ICD-10-CM

## 2020-04-05 DIAGNOSIS — O98313 Other infections with a predominantly sexual mode of transmission complicating pregnancy, third trimester: Secondary | ICD-10-CM

## 2020-04-05 DIAGNOSIS — Z362 Encounter for other antenatal screening follow-up: Secondary | ICD-10-CM | POA: Diagnosis not present

## 2020-04-05 DIAGNOSIS — O0990 Supervision of high risk pregnancy, unspecified, unspecified trimester: Secondary | ICD-10-CM

## 2020-04-05 DIAGNOSIS — A6009 Herpesviral infection of other urogenital tract: Secondary | ICD-10-CM

## 2020-04-05 DIAGNOSIS — K831 Obstruction of bile duct: Secondary | ICD-10-CM | POA: Insufficient documentation

## 2020-04-05 DIAGNOSIS — O0993 Supervision of high risk pregnancy, unspecified, third trimester: Secondary | ICD-10-CM

## 2020-04-05 DIAGNOSIS — O26893 Other specified pregnancy related conditions, third trimester: Secondary | ICD-10-CM

## 2020-04-05 DIAGNOSIS — O99713 Diseases of the skin and subcutaneous tissue complicating pregnancy, third trimester: Secondary | ICD-10-CM

## 2020-04-05 DIAGNOSIS — L299 Pruritus, unspecified: Secondary | ICD-10-CM

## 2020-04-05 LAB — POCT URINALYSIS DIP (DEVICE)
Bilirubin Urine: NEGATIVE
Glucose, UA: NEGATIVE mg/dL
Hgb urine dipstick: NEGATIVE
Ketones, ur: 40 mg/dL — AB
Nitrite: NEGATIVE
Protein, ur: NEGATIVE mg/dL
Specific Gravity, Urine: >=1.03 (ref 1.005–1.030)
Urobilinogen, UA: 0.2 mg/dL (ref 0.0–1.0)
pH: 6 (ref 5.0–8.0)

## 2020-04-05 NOTE — Progress Notes (Signed)
   PRENATAL VISIT NOTE  Subjective:  Jasmine Thornton is a 24 y.o. G1P0 at [redacted]w[redacted]d being seen today for ongoing prenatal care.  She is currently monitored for the following issues for this high-risk pregnancy and has Supervision of high risk pregnancy, antepartum; Asthma; Herpes genitalis in women; Genital herpes affecting pregnancy, antepartum; History of COVID-19; Myalgia; Pruritus of pregnancy in third trimester; and Intrahepatic cholestasis of pregnancy, antepartum on their problem list.  Patient reports no complaints.  Contractions: Irritability. Vag. Bleeding: None.  Movement: Present. Denies leaking of fluid.   The following portions of the patient's history were reviewed and updated as appropriate: allergies, current medications, past family history, past medical history, past social history, past surgical history and problem list.   Objective:   Vitals:   04/05/20 1446  BP: 117/81  Pulse: 94  Weight: 154 lb 12.8 oz (70.2 kg)    Fetal Status: Fetal Heart Rate (bpm): NST   Movement: Present     General:  Alert, oriented and cooperative. Patient is in no acute distress.  Skin: Skin is warm and dry. No rash noted.   Cardiovascular: Normal heart rate noted  Respiratory: Normal respiratory effort, no problems with respiration noted  Abdomen: Soft, gravid, appropriate for gestational age.  Pain/Pressure: Present     Pelvic: Cervical exam deferred        Extremities: Normal range of motion.  Edema: None  Mental Status: Normal mood and affect. Normal behavior. Normal judgment and thought content.   Assessment and Plan:  Pregnancy: G1P0 at [redacted]w[redacted]d 1. Supervision of high risk pregnancy, antepartum Up to date Desires WB -- needs appt with CNM. Has taken class.  Discussed that if on pitocin likely cannot be in water but if labors off cytotec and FB then she can be in tub.  Patient is amenable to outpatient FB placement at 37wk0d and then IOL [redacted]w[redacted]d - Comprehensive metabolic panel  2.  Intrahepatic cholestasis of pregnancy, antepartum IOL at 37 wks discussed Continue antenatal testing NST today- 130s/mod/+accels no decels---Reactive and reassuring - Comprehensive metabolic panel  3. Herpes genitalis in women Start PPX at 33wk - next appt (early given plan for IOL at 86)  4. Pruritus of pregnancy in third trimester Much improved Needs refill of meds- sent in  Preterm labor symptoms and general obstetric precautions including but not limited to vaginal bleeding, contractions, leaking of fluid and fetal movement were reviewed in detail with the patient. Please refer to After Visit Summary for other counseling recommendations.   Return in about 1 week (around 04/12/2020) for weekly NST/BPP and HOB/needs to see CNM for WB discussion.  Future Appointments  Date Time Provider Department Center  04/12/2020  2:15 PM [facility] NST [facility]  04/18/2020  1:15 PM [facility]  04/18/2020  2:35 PM [facility]  04/26/2020  8:15 AM [facility]  04/26/2020  9:15 AM Anyanwu, Jethro Bastos, MD [facility]  05/03/2020  8:15 AM [facility] NST [facility]  05/03/2020  9:15 AM Adam Phenix, MD [facility]  05/09/2020  8:15 AM [facility] NST [facility]    Federico Flake, MD

## 2020-04-05 NOTE — Progress Notes (Signed)
Pt had [place] for growth/BPP @ [facility] today

## 2020-04-06 LAB — COMPREHENSIVE METABOLIC PANEL
ALT: 119 IU/L — ABNORMAL HIGH (ref 0–32)
AST: 54 IU/L — ABNORMAL HIGH (ref 0–40)
Albumin/Globulin Ratio: 1.1 — ABNORMAL LOW (ref 1.2–2.2)
Albumin: 3.6 g/dL — ABNORMAL LOW (ref 3.9–5.0)
Alkaline Phosphatase: 93 IU/L (ref 48–121)
BUN/Creatinine Ratio: 13 (ref 9–23)
BUN: 9 mg/dL (ref 6–20)
Bilirubin Total: 0.4 mg/dL (ref 0.0–1.2)
CO2: 19 mmol/L — ABNORMAL LOW (ref 20–29)
Calcium: 8.9 mg/dL (ref 8.7–10.2)
Chloride: 104 mmol/L (ref 96–106)
Creatinine, Ser: 0.71 mg/dL (ref 0.57–1.00)
GFR calc Af Amer: 140 mL/min/{1.73_m2} (ref >59)
GFR calc non Af Amer: 121 mL/min/{1.73_m2} (ref >59)
Globulin, Total: 3.2 g/dL (ref 1.5–4.5)
Glucose: 62 mg/dL — ABNORMAL LOW (ref 65–99)
Potassium: 4.4 mmol/L (ref 3.5–5.2)
Sodium: 134 mmol/L (ref 134–144)
Total Protein: 6.8 g/dL (ref 6.0–8.5)

## 2020-04-06 MED ORDER — URSODIOL 500 MG PO TABS: 500.0000 mg | ORAL_TABLET | Freq: Two times a day (BID) | ORAL | 2 refills | Status: DC

## 2020-04-12 ENCOUNTER — Ambulatory Visit (INDEPENDENT_AMBULATORY_CARE_PROVIDER_SITE_OTHER): Payer: Medicaid Other | Admitting: *Deleted

## 2020-04-12 ENCOUNTER — Other Ambulatory Visit: Payer: Self-pay

## 2020-04-12 ENCOUNTER — Ambulatory Visit: Payer: Self-pay

## 2020-04-12 VITALS — BP 120/83 | HR 107 | Wt 155.1 lb

## 2020-04-12 DIAGNOSIS — O26613 Liver and biliary tract disorders in pregnancy, third trimester: Secondary | ICD-10-CM

## 2020-04-12 DIAGNOSIS — O26619 Liver and biliary tract disorders in pregnancy, unspecified trimester: Secondary | ICD-10-CM

## 2020-04-12 DIAGNOSIS — Z3A33 33 weeks gestation of pregnancy: Secondary | ICD-10-CM

## 2020-04-12 DIAGNOSIS — K831 Obstruction of bile duct: Secondary | ICD-10-CM

## 2020-04-12 NOTE — Progress Notes (Signed)
Pt reports decreased FM x3 days. She felt good FM during BPP and NST. Per Dr. Shawnie Pons, pt does not require provider visit today.   Pt informed that the ultrasound is considered a limited OB ultrasound and is not intended to be a complete ultrasound exam.  Patient also informed that the ultrasound is not being completed with the intent of assessing for fetal or placental anomalies or any pelvic abnormalities.  Explained that the purpose of today's ultrasound is to assess for presentation, BPP and amniotic fluid volume.  Patient acknowledges the purpose of the exam and the limitations of the study.

## 2020-04-13 ENCOUNTER — Telehealth: Payer: Self-pay | Admitting: Advanced Practice Midwife

## 2020-04-13 NOTE — Progress Notes (Signed)
NST:  Baseline: 135 bpm, Variability: Good {> 6 bpm), Accelerations: Reactive and Decelerations: Absent   

## 2020-04-15 NOTE — Telephone Encounter (Signed)
error 

## 2020-04-18 ENCOUNTER — Ambulatory Visit (INDEPENDENT_AMBULATORY_CARE_PROVIDER_SITE_OTHER): Payer: Medicaid Other

## 2020-04-18 ENCOUNTER — Ambulatory Visit (INDEPENDENT_AMBULATORY_CARE_PROVIDER_SITE_OTHER): Payer: Medicaid Other | Admitting: Certified Nurse Midwife

## 2020-04-18 ENCOUNTER — Ambulatory Visit: Payer: Medicaid Other | Admitting: *Deleted

## 2020-04-18 ENCOUNTER — Other Ambulatory Visit: Payer: Self-pay

## 2020-04-18 ENCOUNTER — Encounter: Payer: Self-pay | Admitting: Certified Nurse Midwife

## 2020-04-18 VITALS — BP 113/75 | HR 105 | Wt 155.5 lb

## 2020-04-18 DIAGNOSIS — K831 Obstruction of bile duct: Secondary | ICD-10-CM

## 2020-04-18 DIAGNOSIS — O26619 Liver and biliary tract disorders in pregnancy, unspecified trimester: Secondary | ICD-10-CM

## 2020-04-18 DIAGNOSIS — Z3A34 34 weeks gestation of pregnancy: Secondary | ICD-10-CM

## 2020-04-18 DIAGNOSIS — A6009 Herpesviral infection of other urogenital tract: Secondary | ICD-10-CM

## 2020-04-18 DIAGNOSIS — O26613 Liver and biliary tract disorders in pregnancy, third trimester: Secondary | ICD-10-CM

## 2020-04-18 DIAGNOSIS — O0993 Supervision of high risk pregnancy, unspecified, third trimester: Secondary | ICD-10-CM

## 2020-04-18 DIAGNOSIS — O98313 Other infections with a predominantly sexual mode of transmission complicating pregnancy, third trimester: Secondary | ICD-10-CM

## 2020-04-18 DIAGNOSIS — O0990 Supervision of high risk pregnancy, unspecified, unspecified trimester: Secondary | ICD-10-CM

## 2020-04-18 NOTE — Progress Notes (Signed)
   PRENATAL VISIT NOTE  Subjective:  Jasmine Thornton is a 24 y.o. G1P0 at [redacted]w[redacted]d being seen today for ongoing prenatal care.  She is currently monitored for the following issues for this high-risk pregnancy and has Supervision of high risk pregnancy, antepartum; Asthma; Herpes genitalis in women; Genital herpes affecting pregnancy, antepartum; History of COVID-19; Myalgia; Pruritus of pregnancy in third trimester; and Intrahepatic cholestasis of pregnancy, antepartum on their problem list.  Patient reports no complaints.  Contractions: Irregular. Vag. Bleeding: None.  Movement: Present. Denies leaking of fluid.   The following portions of the patient's history were reviewed and updated as appropriate: allergies, current medications, past family history, past medical history, past social history, past surgical history and problem list.   Objective:   Vitals:   04/18/20 1340  BP: 113/75  Pulse: (!) 105  Weight: 155 lb 8 oz (70.5 kg)    Fetal Status: Fetal Heart Rate (bpm): NST   Movement: Present     General:  Alert, oriented and cooperative. Patient is in no acute distress.  Skin: Skin is warm and dry. No rash noted.   Cardiovascular: Normal heart rate noted  Respiratory: Normal respiratory effort, no problems with respiration noted  Abdomen: Soft, gravid, appropriate for gestational age.  Pain/Pressure: Present     Pelvic: Cervical exam deferred        Extremities: Normal range of motion.  Edema: None  Mental Status: Normal mood and affect. Normal behavior. Normal judgment and thought content.   Assessment and Plan:  Pregnancy: G1P0 at [redacted]w[redacted]d 1. Supervision of high risk pregnancy, antepartum - Patient doing well, no complaints - Routine prenatal care - Anticipatory guidance on upcoming appointments, continue weekly appointments for antenatal screening, GBS at 36 weeks  - Patient was interested in waterbirth and has completed class, discussed with patient that waterbirth is  contraindicated d/t cholestasis and the need for IOL at 37 weeks, patient verbalizes understanding.  - Answered patient's questions on IOL. IOL scheduled for 6/30 with patient to come into office on 6/29 for OP FB   2. Intrahepatic cholestasis of pregnancy, antepartum - Plan for IOL on 6/30  - Orders for admission placed  - Patient reports that since starting ursodiol itching has stopped - Patient was diagnosed with cholestasis back in April d/t elevated LFT and pruritis   3. Herpes genitalis in women - Continue suppression   Preterm labor symptoms and general obstetric precautions including but not limited to vaginal bleeding, contractions, leaking of fluid and fetal movement were reviewed in detail with the patient. Please refer to After Visit Summary for other counseling recommendations.   Return in about 8 days (around 04/26/2020) for weekly as scheduled.  Future Appointments  Date Time Provider Department Center  04/26/2020  8:15 AM [facility] NST [facility]  04/26/2020  9:15 AM Anyanwu, Jethro Bastos, MD [facility]  05/03/2020  8:15 AM [facility] NST [facility]  05/03/2020  9:15 AM Adam Phenix, MD [facility]  05/09/2020  8:15 AM [facility] NST [facility]    Sharyon Cable, CNM

## 2020-04-18 NOTE — Progress Notes (Signed)
Pt reports improved fetal movement since last week.

## 2020-04-18 NOTE — Progress Notes (Signed)
Induction Assessment Scheduling Form: Fax to [facility]:  [phone] Route to [facility] Labor Delivery   Jasmine Thornton                                                                                   DOB:  April 19, 1996                                                            MRN:  [id number]  Phone:  Home Phone [phone]  Mobile [phone]    Provider:  [facility] (Faculty Practice)  Admission Date/Time:  6/30 at [place] GP:  G1P0     Gestational age on admission:  37.1                                                Estimated Date of Delivery: 05/29/20  Dating Criteria: LMP  Filed Weights   04/18/20 1340  Weight: 155 lb 8 oz (70.5 kg)    GBS:  Pending  HIV:  Non Reactive (04/27 0921)  Reason for induction:  Cholestasis during pregnancy     Method of induction(proposed):  Pitocin   Scheduling Provider Signature:  Sharyon Cable, CNM                                            Today's Date:  04/18/2020

## 2020-04-18 NOTE — Patient Instructions (Addendum)
Balloon Catheter Placement for Cervical Ripening Balloon catheter placement for cervical ripening is a procedure to help your cervix start to soften (ripen) and open (dilate). It is done to prepare your body for labor induction. During this procedure, a thin tube (catheter) is placed through your cervix. A tiny balloon attached to the catheter is inflated with water. Pressure from the balloon is what helps your cervix start to open. Cervical ripening with a balloon catheter can make labor induction shorter and easier. You may have this procedure if:  Your cervix is not ready for labor.  Your health care provider has planned labor induction.  You are not having twins or multiples.  Your baby is in the head-down position.  You do not have any other pregnancy complications that require you to be monitored in the hospital after balloon catheter placement. If your health care provider has recommended labor induction to stimulate a vaginal birth, this procedure may be started the day before induction. You will go home with the balloon in place and return to start induction in 12-24 hours. You may have this procedure and stay in the hospital so that your progress can be monitored as well. Tell a health care provider about:  Any allergies you have.  All medicines you are taking, including vitamins, herbs, eye drops, creams, and over-the-counter medicines.  Any blood disorders you have.  Any surgeries you have had.  Any medical conditions you have. What are the risks? Generally, this is a safe procedure. However, problems may occur, including:  Infection.  Bleeding.  Cramping or pain.  Difficulty passing urine.  The baby moving from the head-down position to a position with the feet or buttocks down (breech position). What happens before the procedure?  Your health care provider may check your baby's heartbeat (fetal monitoring) before the procedure.  You may be asked to empty your  bladder. What happens during the procedure?   You will be positioned on the exam table as if you were having a pelvic exam or Pap test.  Your health care provider may insert a medical instrument into your vagina (speculum) to see your cervix.  Your cervix may be cleaned with a germ-killing solution.  The catheter will be inserted through the opening of your cervix.  A balloon on the end of the catheter will be inflated with sterile water. Some catheters have two balloons, one on each side of the cervix.  Depending on the type of balloon catheter, the end of the catheter may be left free outside your cervix or taped to your leg. The procedure may vary among health care providers and hospitals. What can I expect after the procedure?  After the procedure, it is common to have: ? A feeling of pressure inside your vagina. ? Light vaginal bleeding (spotting).  You may have fetal monitoring before going home.  You may be sent home with the catheter in place and asked to return to start your induction in about 12-24 hours. Follow these instructions at home:  Take over-the-counter and prescription medicines only as told by your health care provider.  Return to your normal activities at home as told by your health care provider. Ask your health care provider what activities are safe for you. Do not leave home until you return for labor induction.  You may shower at home. Do not take baths, swim, or use a hot tub unless your health care provider approves.  As your cervix opens, your catheter and balloon may   fall out before you return for labor induction. Ask your health care provider what you should do if this happens.  Keep all follow-up visits as told by your health care provider. This is important. You will need to return to start induction as told by your health care provider. Contact your health care provider if:  You have chills or a fever.  You have constant pain or cramps (not  contractions).  You have trouble passing urine.  Your water breaks.  You have vaginal bleeding that is heavier than spotting.  You have contractions that start to last longer and come closer together (about every 5 minutes).  The balloon catheter falls out before you return to start your induction. Summary  Cervical ripening with placement of a balloon catheter is an outpatient procedure to prepare you for labor induction.  Cervical ripening with a balloon catheter helps your cervix start to open for birth.  You will be positioned on the exam table. The catheter will be inserted through the opening of your cervix. A balloon on the end of the catheter will be inflated with water.  Pressure from the balloon will cause ripening of your cervix. You will go home with the balloon in place and return to start induction in 12-24 hours.  Contact your health care provider if you have pain, fever, vaginal bleeding, or trouble passing urine. Also contact him or her if your water breaks, you start to go into labor, or your balloon catheter falls out before you return to start your induction. This information is not intended to replace advice given to you by your health care provider. Make sure you discuss any questions you have with your health care provider. Document Revised: 06/25/2018 Document Reviewed: 06/25/2018 Elsevier Patient Education  2020 Elsevier Inc.  

## 2020-04-26 ENCOUNTER — Ambulatory Visit (INDEPENDENT_AMBULATORY_CARE_PROVIDER_SITE_OTHER): Payer: Medicaid Other

## 2020-04-26 ENCOUNTER — Other Ambulatory Visit: Payer: Self-pay

## 2020-04-26 ENCOUNTER — Ambulatory Visit (INDEPENDENT_AMBULATORY_CARE_PROVIDER_SITE_OTHER): Payer: Medicaid Other | Admitting: Obstetrics & Gynecology

## 2020-04-26 ENCOUNTER — Encounter: Payer: Self-pay | Admitting: Obstetrics & Gynecology

## 2020-04-26 ENCOUNTER — Ambulatory Visit: Payer: Medicaid Other | Admitting: *Deleted

## 2020-04-26 ENCOUNTER — Other Ambulatory Visit (HOSPITAL_COMMUNITY): Payer: Self-pay | Admitting: Advanced Practice Midwife

## 2020-04-26 VITALS — BP 111/71 | HR 79 | Wt 155.6 lb

## 2020-04-26 DIAGNOSIS — O0990 Supervision of high risk pregnancy, unspecified, unspecified trimester: Secondary | ICD-10-CM

## 2020-04-26 DIAGNOSIS — K831 Obstruction of bile duct: Secondary | ICD-10-CM

## 2020-04-26 DIAGNOSIS — O26613 Liver and biliary tract disorders in pregnancy, third trimester: Secondary | ICD-10-CM

## 2020-04-26 DIAGNOSIS — O0993 Supervision of high risk pregnancy, unspecified, third trimester: Secondary | ICD-10-CM

## 2020-04-26 DIAGNOSIS — O26619 Liver and biliary tract disorders in pregnancy, unspecified trimester: Secondary | ICD-10-CM

## 2020-04-26 DIAGNOSIS — Z3A35 35 weeks gestation of pregnancy: Secondary | ICD-10-CM

## 2020-04-26 NOTE — Patient Instructions (Addendum)
Return to office for any scheduled appointments. Call the office or go to the MAU at [facility] if:  You begin to have strong, frequent contractions  Your water breaks.  Sometimes it is a big gush of fluid, sometimes it is just a trickle that keeps getting your panties wet or running down your legs  You have vaginal bleeding.  It is normal to have a small amount of spotting if your cervix was checked.   You do not feel your baby moving like normal.  If you do not, get something to eat and drink and lay down and focus on feeling your baby move.   If your baby is still not moving like normal, you should call the office or go to MAU.  Any other obstetric concerns.  OUTPATIENT FOLEY BULB INDUCTION OF LABOR:  Information Sheet for Mothers and Family               What's a Foley Bulb Induction? A Foley bulb induction is a procedure where your provider inserts a catheter into your cervix. Once inside your womb, your provider inflates the balloon with a saline solution.   This puts pressure on your cervix and encourages dilation. The catheter falls out once your cervix dilates to 3-4 centimeters.     With any procedure, it's important that you know what to expect. The insertion of a Foley catheter can be a bit uncomfortable, and some women experience sharp pelvic pain. The pain may subside once the catheter is in place. You may experience some cramping when the Foley catheter is in place.  This is normal.     GO TO THE MATERNITY ADMISSIONS UNIT FOR THE FOLLOWING:  Heavy vaginal bleeding  Rupture of membranes (fluid that wets your underwear)  Painful uterine contractions every 5 minutes or less  Severe abdominal discomfort  Decreased movement of the baby     

## 2020-04-26 NOTE — Progress Notes (Signed)
   PRENATAL VISIT NOTE  Subjective:  Jasmine Thornton is a 24 y.o. G1P0 at [redacted]w[redacted]d being seen today for ongoing prenatal care.  She is currently monitored for the following issues for this high-risk pregnancy and has Supervision of high risk pregnancy, antepartum; Asthma; Herpes genitalis in women; Genital herpes affecting pregnancy, antepartum; History of COVID-19; Myalgia; Pruritus of pregnancy in third trimester; and Intrahepatic cholestasis of pregnancy, antepartum on their problem list.  Patient reports no complaints.  Contractions: Irregular. Vag. Bleeding: None.  Movement: Present. Denies leaking of fluid.   The following portions of the patient's history were reviewed and updated as appropriate: allergies, current medications, past family history, past medical history, past social history, past surgical history and problem list.   Objective:   Vitals:   04/26/20 0833  BP: 111/71  Pulse: 79  Weight: 155 lb 9.6 oz (70.6 kg)    Fetal Status: Fetal Heart Rate (bpm): NST   Movement: Present     General:  Alert, oriented and cooperative. Patient is in no acute distress.  Skin: Skin is warm and dry. No rash noted.   Cardiovascular: Normal heart rate noted  Respiratory: Normal respiratory effort, no problems with respiration noted  Abdomen: Soft, gravid, appropriate for gestational age.  Pain/Pressure: Present     Pelvic: Cervical exam deferred        Extremities: Normal range of motion.     Mental Status: Normal mood and affect. Normal behavior. Normal judgment and thought content.   Assessment and Plan:  Pregnancy: G1P0 at [redacted]w[redacted]d 1. Intrahepatic cholestasis of pregnancy, antepartum Continue Ursodiol, antenatal testing. NST performed today was reviewed and was found to be reactive. Subsequent BPP performed today was also reviewed and was found to be 8/10 (no breathing) . AFI was also normal. Continue recommended antenatal testing and prenatal care.  Discussed IOL at 37 weeks. This has been  scheduled, orders have been signed and held. Offered outpatient foley placement for cervical ripening, she agreed to this and it will be scheduled, information given to her to review at home. She was told to expect a call from [facility] L[facility] with further instructions about her pre-admission COVID screening and any further instructions about the induction of labor.   2. [redacted] weeks gestation of pregnancy 3. Supervision of high risk pregnancy, antepartum Preterm labor symptoms and general obstetric precautions including but not limited to vaginal bleeding, contractions, leaking of fluid and fetal movement were reviewed in detail with the patient. Pelvic cultures next week.  Please refer to After Visit Summary for other counseling recommendations.   Return in about 1 week (around 05/03/2020) for as scheduled:  6/29 Needs HOB for foley bulb and NST.  Future Appointments  Date Time Provider Department Center  05/03/2020  8:15 AM [facility] NST [facility]  05/03/2020  9:15 AM Adam Phenix, MD [facility]  05/09/2020 12:00 AM [facility] ROOM [facility] None    Jaynie Collins, MD

## 2020-04-27 ENCOUNTER — Encounter (HOSPITAL_COMMUNITY): Payer: Self-pay | Admitting: *Deleted

## 2020-04-27 ENCOUNTER — Telehealth (HOSPITAL_COMMUNITY): Payer: Self-pay | Admitting: *Deleted

## 2020-04-27 NOTE — Telephone Encounter (Signed)
Preadmission screen  

## 2020-05-03 ENCOUNTER — Ambulatory Visit: Payer: Self-pay

## 2020-05-03 ENCOUNTER — Ambulatory Visit (INDEPENDENT_AMBULATORY_CARE_PROVIDER_SITE_OTHER): Payer: Medicaid Other | Admitting: General Practice

## 2020-05-03 ENCOUNTER — Other Ambulatory Visit (HOSPITAL_COMMUNITY)
Admission: RE | Admit: 2020-05-03 | Discharge: 2020-05-03 | Disposition: A | Discharge status: Home / Self Care | Payer: Medicaid Other | Source: Ambulatory Visit | Attending: Obstetrics & Gynecology | Admitting: Obstetrics & Gynecology

## 2020-05-03 ENCOUNTER — Ambulatory Visit (INDEPENDENT_AMBULATORY_CARE_PROVIDER_SITE_OTHER): Payer: Medicaid Other | Admitting: Obstetrics & Gynecology

## 2020-05-03 ENCOUNTER — Other Ambulatory Visit: Payer: Self-pay

## 2020-05-03 VITALS — BP 114/78 | Wt 158.0 lb

## 2020-05-03 DIAGNOSIS — O0993 Supervision of high risk pregnancy, unspecified, third trimester: Secondary | ICD-10-CM

## 2020-05-03 DIAGNOSIS — Z3A36 36 weeks gestation of pregnancy: Secondary | ICD-10-CM | POA: Diagnosis not present

## 2020-05-03 DIAGNOSIS — O26619 Liver and biliary tract disorders in pregnancy, unspecified trimester: Secondary | ICD-10-CM

## 2020-05-03 DIAGNOSIS — K831 Obstruction of bile duct: Secondary | ICD-10-CM

## 2020-05-03 DIAGNOSIS — O0990 Supervision of high risk pregnancy, unspecified, unspecified trimester: Secondary | ICD-10-CM | POA: Insufficient documentation

## 2020-05-03 DIAGNOSIS — O26613 Liver and biliary tract disorders in pregnancy, third trimester: Secondary | ICD-10-CM

## 2020-05-03 NOTE — Progress Notes (Signed)
   PRENATAL VISIT NOTE  Subjective:  Jasmine Thornton is a 24 y.o. G1P0 at [redacted]w[redacted]d being seen today for ongoing prenatal care.  She is currently monitored for the following issues for this high-risk pregnancy and has Supervision of high risk pregnancy, antepartum; Asthma; Herpes genitalis in women; Genital herpes affecting pregnancy, antepartum; History of COVID-19; Myalgia; Pruritus of pregnancy in third trimester; and Intrahepatic cholestasis of pregnancy, antepartum on their problem list.  Patient reports occasional contractions.  Contractions: Irregular. Vag. Bleeding: None.  Movement: Present. Denies leaking of fluid.   The following portions of the patient's history were reviewed and updated as appropriate: allergies, current medications, past family history, past medical history, past social history, past surgical history and problem list.   Objective:   Vitals:   05/03/20 0926  BP: 114/78  Weight: 158 lb (71.7 kg)    Fetal Status: Fetal Heart Rate (bpm): NST   Movement: Present     General:  Alert, oriented and cooperative. Patient is in no acute distress.  Skin: Skin is warm and dry. No rash noted.   Cardiovascular: Normal heart rate noted  Respiratory: Normal respiratory effort, no problems with respiration noted  Abdomen: Soft, gravid, appropriate for gestational age.  Pain/Pressure: Present     Pelvic: Cervical exam deferred      Pt request  Extremities: Normal range of motion.     Mental Status: Normal mood and affect. Normal behavior. Normal judgment and thought content.   Assessment and Plan:  Pregnancy: G1P0 at [redacted]w[redacted]d 1. Supervision of high risk pregnancy, antepartum Routine screen - Culture, beta strep (group b only) - GC/Chlamydia probe amp ([place])not at [facility]  2. Intrahepatic cholestasis of pregnancy, antepartum IOL 37 weeks, Foley insertion scheduled  Preterm labor symptoms and general obstetric precautions including but not limited to vaginal bleeding,  contractions, leaking of fluid and fetal movement were reviewed in detail with the patient. Please refer to After Visit Summary for other counseling recommendations.   Return in about 5 days (around 05/08/2020) for Foley.  Future Appointments  Date Time Provider Department Center  05/08/2020 10:25 AM Constant, Gigi Gin, MD [facility]  05/09/2020 12:00 AM [facility] SCHED ROOM [facility] None    Scheryl Darter, MD

## 2020-05-03 NOTE — Patient Instructions (Signed)

## 2020-05-03 NOTE — Progress Notes (Signed)
Pt informed that the ultrasound is considered a limited OB ultrasound and is not intended to be a complete ultrasound exam.  Patient also informed that the ultrasound is not being completed with the intent of assessing for fetal or placental anomalies or any pelvic abnormalities.  Explained that the purpose of today's ultrasound is to assess for  BPP, presentation and AFI.  Patient acknowledges the purpose of the exam and the limitations of the study.     Etta Gassett H RN BSN 05/03/20  

## 2020-05-04 LAB — GC/CHLAMYDIA PROBE AMP (~~LOC~~) NOT AT ARMC
Chlamydia: NEGATIVE
Comment: NEGATIVE
Comment: NORMAL
Neisseria Gonorrhea: NEGATIVE

## 2020-05-07 ENCOUNTER — Other Ambulatory Visit (HOSPITAL_COMMUNITY): Payer: Medicaid Other

## 2020-05-07 LAB — CULTURE, BETA STREP (GROUP B ONLY): Strep Gp B Culture: NEGATIVE

## 2020-05-08 ENCOUNTER — Ambulatory Visit (INDEPENDENT_AMBULATORY_CARE_PROVIDER_SITE_OTHER): Payer: Medicaid Other | Admitting: Obstetrics and Gynecology

## 2020-05-08 ENCOUNTER — Other Ambulatory Visit: Payer: Self-pay

## 2020-05-08 ENCOUNTER — Encounter: Payer: Self-pay | Admitting: Obstetrics and Gynecology

## 2020-05-08 VITALS — BP 122/84 | HR 127 | Wt 159.8 lb

## 2020-05-08 DIAGNOSIS — Z3A37 37 weeks gestation of pregnancy: Secondary | ICD-10-CM

## 2020-05-08 DIAGNOSIS — K831 Obstruction of bile duct: Secondary | ICD-10-CM

## 2020-05-08 DIAGNOSIS — A6009 Herpesviral infection of other urogenital tract: Secondary | ICD-10-CM

## 2020-05-08 DIAGNOSIS — O98319 Other infections with a predominantly sexual mode of transmission complicating pregnancy, unspecified trimester: Secondary | ICD-10-CM

## 2020-05-08 DIAGNOSIS — O26613 Liver and biliary tract disorders in pregnancy, third trimester: Secondary | ICD-10-CM

## 2020-05-08 DIAGNOSIS — O98313 Other infections with a predominantly sexual mode of transmission complicating pregnancy, third trimester: Secondary | ICD-10-CM

## 2020-05-08 DIAGNOSIS — O26619 Liver and biliary tract disorders in pregnancy, unspecified trimester: Secondary | ICD-10-CM

## 2020-05-08 DIAGNOSIS — O0993 Supervision of high risk pregnancy, unspecified, third trimester: Secondary | ICD-10-CM

## 2020-05-08 DIAGNOSIS — O0990 Supervision of high risk pregnancy, unspecified, unspecified trimester: Secondary | ICD-10-CM

## 2020-05-08 NOTE — Progress Notes (Signed)
   PRENATAL VISIT NOTE  Subjective:  Jasmine Thornton is a 24 y.o. G1P0 at [redacted]w[redacted]d being seen today for ongoing prenatal care.  She is currently monitored for the following issues for this high-risk pregnancy and has Supervision of high risk pregnancy, antepartum; Asthma; Herpes genitalis in women; Genital herpes affecting pregnancy, antepartum; History of COVID-19; Myalgia; Pruritus of pregnancy in third trimester; and Intrahepatic cholestasis of pregnancy, antepartum on their problem list.  Patient reports no complaints.  Contractions: Irregular. Vag. Bleeding: None.  Movement: Present. Denies leaking of fluid.   The following portions of the patient's history were reviewed and updated as appropriate: allergies, current medications, past family history, past medical history, past social history, past surgical history and problem list.   Objective:   Vitals:   05/08/20 1047 05/08/20 1051  BP: (!) 125/94 122/84  Pulse: (!) 127 (!) 127  Weight: 159 lb 12.8 oz (72.5 kg)     Fetal Status: Fetal Heart Rate (bpm): 145 Fundal Height: 37 cm Movement: Present     General:  Alert, oriented and cooperative. Patient is in no acute distress.  Skin: Skin is warm and dry. No rash noted.   Cardiovascular: Normal heart rate noted  Respiratory: Normal respiratory effort, no problems with respiration noted  Abdomen: Soft, gravid, appropriate for gestational age.  Pain/Pressure: Present     Pelvic: Cervical exam deferred        Extremities: Normal range of motion.  Edema: None  Mental Status: Normal mood and affect. Normal behavior. Normal judgment and thought content.   Assessment and Plan:  Pregnancy: G1P0 at [redacted]w[redacted]d 1. Supervision of high risk pregnancy, antepartum Patient is doing well without complaints Patient scheduled for IOL at midnight. No foley bulb in the office for outpatient cervical ripenning  2. Intrahepatic cholestasis of pregnancy, antepartum Continue Actigall  3. Genital herpes  affecting pregnancy, antepartum Continue suppression  Term labor symptoms and general obstetric precautions including but not limited to vaginal bleeding, contractions, leaking of fluid and fetal movement were reviewed in detail with the patient. Please refer to After Visit Summary for other counseling recommendations.   Return in about 6 weeks (around 06/19/2020) for postpartum.  Future Appointments  Date Time Provider Department Center  05/09/2020 12:00 AM [facility] SCHED ROOM [facility] None    Catalina Antigua, MD

## 2020-05-08 NOTE — Progress Notes (Signed)
BP elevated first reading. Repeated and wnl. Denies headaches or visual disturbances. States she is nervous about foley bulb insertion.  Alveena Taira,RN

## 2020-05-09 ENCOUNTER — Other Ambulatory Visit: Payer: Medicaid Other

## 2020-05-09 ENCOUNTER — Inpatient Hospital Stay (HOSPITAL_COMMUNITY): Payer: Medicaid Other | Admitting: Anesthesiology

## 2020-05-09 ENCOUNTER — Inpatient Hospital Stay (HOSPITAL_COMMUNITY): Payer: Medicaid Other

## 2020-05-09 ENCOUNTER — Inpatient Hospital Stay (HOSPITAL_COMMUNITY)
Admission: AD | Admit: 2020-05-09 | Discharge: 2020-05-11 | DRG: 805 | Disposition: A | Discharge status: Home / Self Care | Payer: Medicaid Other | Attending: Obstetrics and Gynecology | Admitting: Obstetrics and Gynecology

## 2020-05-09 ENCOUNTER — Encounter (HOSPITAL_COMMUNITY): Payer: Self-pay | Admitting: Family Medicine

## 2020-05-09 DIAGNOSIS — O98319 Other infections with a predominantly sexual mode of transmission complicating pregnancy, unspecified trimester: Secondary | ICD-10-CM | POA: Diagnosis present

## 2020-05-09 DIAGNOSIS — Z30014 Encounter for initial prescription of intrauterine contraceptive device: Secondary | ICD-10-CM

## 2020-05-09 DIAGNOSIS — O9832 Other infections with a predominantly sexual mode of transmission complicating childbirth: Secondary | ICD-10-CM | POA: Diagnosis present

## 2020-05-09 DIAGNOSIS — Z8616 Personal history of COVID-19: Secondary | ICD-10-CM

## 2020-05-09 DIAGNOSIS — K831 Obstruction of bile duct: Secondary | ICD-10-CM | POA: Diagnosis present

## 2020-05-09 DIAGNOSIS — O0990 Supervision of high risk pregnancy, unspecified, unspecified trimester: Secondary | ICD-10-CM

## 2020-05-09 DIAGNOSIS — A6000 Herpesviral infection of urogenital system, unspecified: Secondary | ICD-10-CM | POA: Diagnosis present

## 2020-05-09 DIAGNOSIS — O2662 Liver and biliary tract disorders in childbirth: Secondary | ICD-10-CM | POA: Diagnosis present

## 2020-05-09 DIAGNOSIS — Z3490 Encounter for supervision of normal pregnancy, unspecified, unspecified trimester: Secondary | ICD-10-CM | POA: Diagnosis present

## 2020-05-09 DIAGNOSIS — L299 Pruritus, unspecified: Secondary | ICD-10-CM | POA: Diagnosis present

## 2020-05-09 DIAGNOSIS — A6009 Herpesviral infection of other urogenital tract: Secondary | ICD-10-CM | POA: Diagnosis present

## 2020-05-09 DIAGNOSIS — J45909 Unspecified asthma, uncomplicated: Secondary | ICD-10-CM | POA: Diagnosis present

## 2020-05-09 DIAGNOSIS — Z3A37 37 weeks gestation of pregnancy: Secondary | ICD-10-CM

## 2020-05-09 DIAGNOSIS — O99713 Diseases of the skin and subcutaneous tissue complicating pregnancy, third trimester: Secondary | ICD-10-CM | POA: Diagnosis present

## 2020-05-09 DIAGNOSIS — O9952 Diseases of the respiratory system complicating childbirth: Secondary | ICD-10-CM | POA: Diagnosis present

## 2020-05-09 DIAGNOSIS — Z30430 Encounter for insertion of intrauterine contraceptive device: Secondary | ICD-10-CM

## 2020-05-09 LAB — CBC
HCT: 32.7 % — ABNORMAL LOW (ref 36.0–46.0)
Hemoglobin: 10.6 g/dL — ABNORMAL LOW (ref 12.0–15.0)
MCH: 31.6 pg (ref 26.0–34.0)
MCHC: 32.4 g/dL (ref 30.0–36.0)
MCV: 97.6 fL (ref 80.0–100.0)
Platelets: 240 10*3/uL (ref 150–400)
RBC: 3.35 MIL/uL — ABNORMAL LOW (ref 3.87–5.11)
RDW: 13.5 % (ref 11.5–15.5)
WBC: 10 10*3/uL (ref 4.0–10.5)
nRBC: 0 % (ref 0.0–0.2)

## 2020-05-09 LAB — TYPE AND SCREEN
ABO/RH(D): O POS
Antibody Screen: NEGATIVE

## 2020-05-09 LAB — RPR: RPR Ser Ql: NONREACTIVE

## 2020-05-09 LAB — COMPREHENSIVE METABOLIC PANEL
ALT: 60 U/L — ABNORMAL HIGH (ref 0–44)
AST: 49 U/L — ABNORMAL HIGH (ref 15–41)
Albumin: 3 g/dL — ABNORMAL LOW (ref 3.5–5.0)
Alkaline Phosphatase: 114 U/L (ref 38–126)
Anion gap: 9 (ref 5–15)
BUN: 7 mg/dL (ref 6–20)
CO2: 18 mmol/L — ABNORMAL LOW (ref 22–32)
Calcium: 8.7 mg/dL — ABNORMAL LOW (ref 8.9–10.3)
Chloride: 108 mmol/L (ref 98–111)
Creatinine, Ser: 0.69 mg/dL (ref 0.44–1.00)
GFR calc Af Amer: >60 mL/min (ref >60)
GFR calc non Af Amer: >60 mL/min (ref >60)
Glucose, Bld: 71 mg/dL (ref 70–99)
Potassium: 4.4 mmol/L (ref 3.5–5.1)
Sodium: 135 mmol/L (ref 135–145)
Total Bilirubin: 1.1 mg/dL (ref 0.3–1.2)
Total Protein: 6.2 g/dL — ABNORMAL LOW (ref 6.5–8.1)

## 2020-05-09 LAB — ABO/RH: ABO/RH(D): O POS

## 2020-05-09 MED ORDER — IBUPROFEN 600 MG PO TABS
600.0000 mg | ORAL_TABLET | Freq: Four times a day (QID) | ORAL | Status: DC
  Administered 2020-05-09 – 2020-05-11 (×7): 600 mg via ORAL
  Filled 2020-05-09 (×7): qty 1

## 2020-05-09 MED ORDER — DIPHENHYDRAMINE HCL 25 MG PO CAPS: 25.0000 mg | ORAL_CAPSULE | Freq: Four times a day (QID) | ORAL | Status: DC | PRN

## 2020-05-09 MED ORDER — FENTANYL CITRATE (PF) 100 MCG/2ML IJ SOLN
INTRAMUSCULAR | Status: AC
  Filled 2020-05-09: qty 2

## 2020-05-09 MED ORDER — PHENYLEPHRINE 40 MCG/ML (10ML) SYRINGE FOR IV PUSH (FOR BLOOD PRESSURE SUPPORT): 80.0000 ug | PREFILLED_SYRINGE | INTRAVENOUS | Status: DC | PRN

## 2020-05-09 MED ORDER — OXYTOCIN-SODIUM CHLORIDE 30-0.9 UT/500ML-% IV SOLN: 2.5000 [IU]/h | INTRAVENOUS | Status: DC

## 2020-05-09 MED ORDER — OXYTOCIN BOLUS FROM INFUSION
500.0000 mL | Freq: Once | INTRAVENOUS | Status: AC
  Administered 2020-05-09: 300 mL via INTRAVENOUS

## 2020-05-09 MED ORDER — FENTANYL-BUPIVACAINE-NACL 0.5-0.125-0.9 MG/250ML-% EP SOLN
12.0000 mL/h | EPIDURAL | Status: DC | PRN
  Filled 2020-05-09: qty 250

## 2020-05-09 MED ORDER — ONDANSETRON HCL 4 MG/2ML IJ SOLN: 4.0000 mg | Freq: Four times a day (QID) | INTRAMUSCULAR | Status: DC | PRN

## 2020-05-09 MED ORDER — SIMETHICONE 80 MG PO CHEW: 80.0000 mg | CHEWABLE_TABLET | ORAL | Status: DC | PRN

## 2020-05-09 MED ORDER — WITCH HAZEL-GLYCERIN EX PADS: 1.0000 "application " | MEDICATED_PAD | CUTANEOUS | Status: DC | PRN

## 2020-05-09 MED ORDER — SODIUM CHLORIDE (PF) 0.9 % IJ SOLN
INTRAMUSCULAR | Status: DC | PRN
  Administered 2020-05-09: 12 mL/h via EPIDURAL

## 2020-05-09 MED ORDER — EPHEDRINE 5 MG/ML INJ: 10.0000 mg | INTRAVENOUS | Status: DC | PRN

## 2020-05-09 MED ORDER — LACTATED RINGERS IV SOLN: 500.0000 mL | INTRAVENOUS | Status: DC | PRN

## 2020-05-09 MED ORDER — LIDOCAINE HCL (PF) 1 % IJ SOLN: 30.0000 mL | INTRAMUSCULAR | Status: DC | PRN

## 2020-05-09 MED ORDER — ONDANSETRON HCL 4 MG PO TABS: 4.0000 mg | ORAL_TABLET | ORAL | Status: DC | PRN

## 2020-05-09 MED ORDER — ACETAMINOPHEN 325 MG PO TABS: 650.0000 mg | ORAL_TABLET | ORAL | Status: DC | PRN

## 2020-05-09 MED ORDER — LIDOCAINE HCL (PF) 1 % IJ SOLN
INTRAMUSCULAR | Status: DC | PRN
  Administered 2020-05-09: 2 mL via EPIDURAL
  Administered 2020-05-09: 3 mL via EPIDURAL
  Administered 2020-05-09: 5 mL via EPIDURAL

## 2020-05-09 MED ORDER — LEVONORGESTREL 19.5 MCG/DAY IU IUD
INTRAUTERINE_SYSTEM | Freq: Once | INTRAUTERINE | Status: AC
  Administered 2020-05-09: 1 via INTRAUTERINE
  Filled 2020-05-09: qty 1

## 2020-05-09 MED ORDER — COCONUT OIL OIL: 1.0000 "application " | TOPICAL_OIL | Status: DC | PRN

## 2020-05-09 MED ORDER — OXYTOCIN-SODIUM CHLORIDE 30-0.9 UT/500ML-% IV SOLN
1.0000 m[IU]/min | INTRAVENOUS | Status: DC
  Administered 2020-05-09: 2 m[IU]/min via INTRAVENOUS
  Filled 2020-05-09: qty 500

## 2020-05-09 MED ORDER — PRENATAL MULTIVITAMIN CH
1.0000 | ORAL_TABLET | Freq: Every day | ORAL | Status: DC
  Administered 2020-05-10 – 2020-05-11 (×2): 1 via ORAL
  Filled 2020-05-09 (×2): qty 1

## 2020-05-09 MED ORDER — LACTATED RINGERS IV SOLN: 500.0000 mL | Freq: Once | INTRAVENOUS | Status: DC

## 2020-05-09 MED ORDER — ALBUTEROL SULFATE (2.5 MG/3ML) 0.083% IN NEBU: 3.0000 mL | INHALATION_SOLUTION | Freq: Four times a day (QID) | RESPIRATORY_TRACT | Status: DC | PRN

## 2020-05-09 MED ORDER — LACTATED RINGERS IV SOLN
500.0000 mL | Freq: Once | INTRAVENOUS | Status: AC
  Administered 2020-05-09: 500 mL via INTRAVENOUS

## 2020-05-09 MED ORDER — SOD CITRATE-CITRIC ACID 500-334 MG/5ML PO SOLN: 30.0000 mL | ORAL | Status: DC | PRN

## 2020-05-09 MED ORDER — LACTATED RINGERS IV SOLN: INTRAVENOUS | Status: DC

## 2020-05-09 MED ORDER — MISOPROSTOL 50MCG HALF TABLET
50.0000 ug | ORAL_TABLET | ORAL | Status: DC | PRN
  Administered 2020-05-09: 50 ug via BUCCAL
  Filled 2020-05-09: qty 1

## 2020-05-09 MED ORDER — DIBUCAINE (PERIANAL) 1 % EX OINT: 1.0000 "application " | TOPICAL_OINTMENT | CUTANEOUS | Status: DC | PRN

## 2020-05-09 MED ORDER — SENNOSIDES-DOCUSATE SODIUM 8.6-50 MG PO TABS
2.0000 | ORAL_TABLET | ORAL | Status: DC
  Administered 2020-05-09 – 2020-05-11 (×2): 2 via ORAL
  Filled 2020-05-09 (×2): qty 2

## 2020-05-09 MED ORDER — TERBUTALINE SULFATE 1 MG/ML IJ SOLN: 0.2500 mg | Freq: Once | INTRAMUSCULAR | Status: DC | PRN

## 2020-05-09 MED ORDER — TETANUS-DIPHTH-ACELL PERTUSSIS 5-2.5-18.5 LF-MCG/0.5 IM SUSP: 0.5000 mL | Freq: Once | INTRAMUSCULAR | Status: DC

## 2020-05-09 MED ORDER — DIPHENHYDRAMINE HCL 50 MG/ML IJ SOLN: 12.5000 mg | INTRAMUSCULAR | Status: DC | PRN

## 2020-05-09 MED ORDER — ZOLPIDEM TARTRATE 5 MG PO TABS: 5.0000 mg | ORAL_TABLET | Freq: Every evening | ORAL | Status: DC | PRN

## 2020-05-09 MED ORDER — URSODIOL 300 MG PO CAPS
600.0000 mg | ORAL_CAPSULE | Freq: Two times a day (BID) | ORAL | Status: DC
  Administered 2020-05-10 (×2): 600 mg via ORAL
  Filled 2020-05-09 (×6): qty 2

## 2020-05-09 MED ORDER — ONDANSETRON HCL 4 MG/2ML IJ SOLN: 4.0000 mg | INTRAMUSCULAR | Status: DC | PRN

## 2020-05-09 MED ORDER — FENTANYL CITRATE (PF) 100 MCG/2ML IJ SOLN
100.0000 ug | INTRAMUSCULAR | Status: DC | PRN
  Administered 2020-05-09 (×2): 100 ug via INTRAVENOUS
  Filled 2020-05-09: qty 2

## 2020-05-09 MED ORDER — BENZOCAINE-MENTHOL 20-0.5 % EX AERO
1.0000 "application " | INHALATION_SPRAY | CUTANEOUS | Status: DC | PRN
  Filled 2020-05-09: qty 56

## 2020-05-09 NOTE — Progress Notes (Signed)
Jasmine Thornton is a 24 y.o. G1P0 at [redacted]w[redacted]d admitted for IOL 2/2 cholestasis  Subjective: Comfortable with epidural  Objective: BP 116/73   Pulse (!) 118   Temp 99.5 F (37.5 C) (Oral)   Resp 16   Ht 5\' 2"  (1.575 m)   Wt 72.4 kg   LMP 08/23/2019 (Exact Date) Comment: Gravidia 1, Para 0, AB 0  BMI 29.19 kg/m  No intake/output data recorded.  FHT:  FHR: 140 bpm, variability: moderate,  accelerations:  Present,  decelerations:  Absent UC:   regular, every 1-3 minutes  SVE:   Dilation: 9 Effacement (%): 100 Station: Plus 1 Exam by:: Foley,rn  Pitocin @ 6 mu/min  Labs: Lab Results  Component Value Date   WBC 10.0 05/09/2020   HGB 10.6 (L) 05/09/2020   HCT 32.7 (L) 05/09/2020   MCV 97.6 05/09/2020   PLT 240 05/09/2020    Assessment / Plan: 24 y.o.G1P0 [redacted]w[redacted]d here for IOL for cholestasis.  #Labor: Progressing well.S/p FB and Cyto x1, SROM at 1244. Pit started, now at 6. Anticipate vaginal delivery. #Pain:per patient request #FWB:Cat I #GBSnegative #Cholestasis: Diagnosed by mildly elevated bile acids and LFTs. Will repeat CMP today. Cont Ursodiol until delivery. AST 54 and ALT 119 on 04/05/20.AST now 49 and ALT now 60.  04/07/20 DO OB Fellow, Faculty [facility] 05/09/2020, 2:16 PM

## 2020-05-09 NOTE — Progress Notes (Signed)
Jasmine Thornton is a 24 y.o. G1P0 at [redacted]w[redacted]d admitted for IOL 2/2 cholestasis  Subjective: Feeling contractions, just woke up from a nap  Objective: BP 119/77   Pulse 88   Temp 98.9 F (37.2 C) (Oral)   Resp 16   Ht 5\' 2"  (1.575 m)   Wt 72.4 kg   LMP 08/23/2019 (Exact Date) Comment: Gravidia 1, Para 0, AB 0  BMI 29.19 kg/m  No intake/output data recorded.  FHT:  FHR: 135 bpm, variability: moderate,  accelerations:  Present,  decelerations:  Absent UC:   irregular, every 1-5 minutes  SVE:   Dilation: 5 Effacement (%): 80 Station: -1 Exam by:: [email or web address]: Lab Results  Component Value Date   WBC 10.0 05/09/2020   HGB 10.6 (L) 05/09/2020   HCT 32.7 (L) 05/09/2020   MCV 97.6 05/09/2020   PLT 240 05/09/2020    Assessment / Plan: 24 y.o. G1P0 [redacted]w[redacted]d here for IOL for cholestasis.  #Labor: Progressing well. S/p FB and Cyto x1. Feeling frequent ctx;  consider Pitocin if no cervical change. AROM as appropriate. Anticipate vaginal delivery. #Pain: per patient request #FWB: Cat I #GBS negative #Cholestasis: Diagnosed by mildly elevated bile acids and LFTs. Will repeat CMP today. Cont Ursodiol until delivery. AST 54 and ALT 119 on 04/05/20. AST now 49 and ALT now 60.  04/07/20 DO OB Fellow, Faculty [facility] 05/09/2020, 9:52 AM

## 2020-05-09 NOTE — Lactation Note (Signed)
This note was copied from a baby's chart. Lactation Consultation Note  Patient Name: Jasmine Thornton OVZCH'Y Date: 05/09/2020 Reason for consult: Initial assessment;1st time breastfeeding;Early term 37-38.6wks;Infant < 6lbs P1, 3 hour ETI infant less than 6 lbs at birth. Mom's hx: COVID at [redacted] weeks gestation, HSV, Cholestasis  Per mom, she has order her DEBP with her insurance. Mom receives [facility] in [place].  Infant had one void since delivery. Per mom, infant  BF for 25 minutes in L&D and recently finished BF for 30 minutes prior LC entering the room . LC did not observe a latch at this time. Tools: Breast shells and  hand pump given to mom due to flat nipples. DEBP given to mom due to being DAT+, less than 6 lbs and ETI infant to help establish milk supply. Mom is working on latching infant at breast,  if  supplementation is needed mom prefers to use donor breast milk, infant will  be reassessed in morning for possible supplementation due to infant's small size. Mom knows to breastfeed infant on demand, 8 to 12 times within 24 hours, not exceed 3 hours without BF infant.  Mom knows to call RN or LC if she has questions, concerns or need assistance with latching infant at breast. Reviewed Baby & Me book's Breastfeeding Basics.  Mom made aware of O/P services, breastfeeding support groups, community resources, and our phone # for post-discharge questions.    Maternal Data Formula Feeding for Exclusion: No Has patient been taught Hand Expression?: Yes Does the patient have breastfeeding experience prior to this delivery?: No  Feeding Feeding Type: Breast Fed  LATCH Score Latch: Grasps breast easily, tongue down, lips flanged, rhythmical sucking.  Audible Swallowing: Spontaneous and intermittent  Type of Nipple: Everted at rest and after stimulation  Comfort (Breast/Nipple): Soft / non-tender  Hold (Positioning): Assistance needed to correctly position infant at breast and  maintain latch.  LATCH Score: 9  Interventions Interventions: Breast feeding basics reviewed;Breast compression;Skin to skin;Breast massage;Hand express;Pre-pump if needed;Expressed milk;Hand pump;DEBP;Shells  Lactation Tools Discussed/Used Tools: Shells;Pump Shell Type: Other (comment) (flat nipples) Breast pump type: Double-Electric Breast Pump;Manual WIC Program: Yes Pump Review: Setup, frequency, and cleaning;Milk Storage Initiated by:: Danelle Earthly, IBCLC Date initiated:: 05/09/20   Consult Status Consult Status: Follow-up Date: 05/10/20 Follow-up type: In-patient    Danelle Earthly 05/09/2020, 6:40 PM

## 2020-05-09 NOTE — H&P (Signed)
OBSTETRIC ADMISSION HISTORY AND PHYSICAL  Jasmine Thornton is a 24 y.o. female G1P0 with IUP at 32w1dby LMP presenting for IOL for cholestasis. She reports +FMs, No LOF, no VB, no blurry vision, headaches or peripheral edema, and RUQ pain.  She plans on breast feeding. She requests IUD for birth control. She received her prenatal care at [facility] By LMP --->  Estimated Date of Delivery: 05/29/20  Sono:  5/27  '@[redacted]w[redacted]d' , CWD, normal anatomy, cephalic presentation, anterior placental lie, 1978g, 45% EFW  Prenatal History/Complications: Cholestasis of pregnancy Hx of HSV on suppression Hx of COVID in April 2021 Maternal Asthma   Past Medical History: Past Medical History:  Diagnosis Date  . Acne   . ADHD (attention deficit hyperactivity disorder), combined type   . Allergic rhinitis   . Asthma   . Cholestasis during pregnancy   . Headache   . Herpes genitalis in women   . Trichomoniasis     Past Surgical History: Past Surgical History:  Procedure Laterality Date  . NO PAST SURGERIES      Obstetrical History: OB History    Gravida  1   Para      Term      Preterm      AB      Living  0     SAB      TAB      Ectopic      Multiple      Live Births              Social History: Social History   Socioeconomic History  . Marital status: Single    Spouse name: Not on file  . Number of children: Not on file  . Years of education: Not on file  . Highest education level: Not on file  Occupational History  . Not on file  Tobacco Use  . Smoking status: Never Smoker  . Smokeless tobacco: Never Used  Vaping Use  . Vaping Use: Never used  Substance and Sexual Activity  . Alcohol use: Not Currently    Alcohol/week: 0.0 standard drinks    Comment: occasionally  . Drug use: No  . Sexual activity: Not Currently    Birth control/protection: None  Other Topics Concern  . Not on file  Social History Narrative  . Not on file   Social Determinants of  Health   Financial Resource Strain:   . Difficulty of Paying Living Expenses:   Food Insecurity: No Food Insecurity  . Worried About RCharity fundraiserin the Last Year: Never true  . Ran Out of Food in the Last Year: Never true  Transportation Needs: No Transportation Needs  . Lack of Transportation (Medical): No  . Lack of Transportation (Non-Medical): No  Physical Activity:   . Days of Exercise per Week:   . Minutes of Exercise per Session:   Stress:   . Feeling of Stress :   Social Connections:   . Frequency of Communication with Friends and Family:   . Frequency of Social Gatherings with Friends and Family:   . Attends Religious Services:   . Active Member of Clubs or Organizations:   . Attends CArchivistMeetings:   .Marland KitchenMarital Status:     Family History: Family History  Problem Relation Age of Onset  . Allergies Mother   . Anemia Mother   . Heart disease Maternal Grandmother     Allergies: No Known Allergies  Medications Prior to  Admission  Medication Sig Dispense Refill Last Dose  . albuterol (PROVENTIL HFA;VENTOLIN HFA) 108 (90 BASE) MCG/ACT inhaler Inhale into the lungs every 6 (six) hours as needed for wheezing or shortness of breath.     . Blood Pressure Monitoring (BLOOD PRESSURE KIT) DEVI 1 Device by Does not apply route as needed. 1 each 0   . hydrOXYzine (ATARAX/VISTARIL) 25 MG tablet Take 1 tablet (25 mg total) by mouth every 8 (eight) hours as needed. 30 tablet 2   . Prenatal Vit-Fe Fumarate-FA (PRENATAL MULTIVITAMIN) TABS tablet Take 1 tablet by mouth daily.     . ursodiol (ACTIGALL) 500 MG tablet Take 1 tablet (500 mg total) by mouth 2 times daily at 12 noon and 4 pm. 60 tablet 2   . valACYclovir (VALTREX) 500 MG tablet Take 1 tablet (500 mg total) by mouth 2 (two) times daily. Start at 36 weeks 90 tablet 1      Review of Systems   All systems reviewed and negative except as stated in HPI  Blood pressure 116/66, pulse (!) 105, temperature  97.7 F (36.5 C), temperature source Oral, resp. rate 19, height '5\' 2"'  (1.575 m), weight 72.4 kg, last menstrual period 08/23/2019. General appearance: alert, cooperative, appears stated age and no distress Lungs: normal effort Heart: regular rate  Abdomen: soft, non-tender; bowel sounds normal Pelvic: gravid uterus GU: No vaginal lesions  Extremities: Homans sign is negative, no sign of DVT Presentation: cephalic Fetal monitoringBaseline: 135 bpm, Variability: Good {> 6 bpm), Accelerations: Reactive and Decelerations: Absent Uterine activity: None Dilation: 1.5 Effacement (%): 60 Station: -2 Exam by:: Dr. Marice Potter    Prenatal labs: ABO, Rh: --/--/PENDING (06/30 0029) Antibody: PENDING (06/30 0029) Rubella: 2.15 (12/29 0000) RPR: Non Reactive (04/27 0921)  HBsAg: Negative (05/13 1718)  HIV: Non Reactive (04/27 0921)  GBS: Negative/-- (06/24 1034)  2 hr Glucola WNL Genetic screening  Low risk female Anatomy US WNL  Prenatal Transfer Tool  Maternal Diabetes: No Genetic Screening: Normal Maternal Ultrasounds/Referrals: Normal Fetal Ultrasounds or other Referrals:  None Maternal Substance Abuse:  No Significant Maternal Medications:  None Significant Maternal Lab Results: Group B Strep negative  Results for orders placed or performed during the hospital encounter of 05/09/20 (from the past 24 hour(s))  Type and screen   Collection Time: 05/09/20 12:29 AM  Result Value Ref Range   ABO/RH(D) PENDING    Antibody Screen PENDING    Sample Expiration      05/12/2020,2359 Performed at [facility], [address] [place], [place] [address]     Patient Active Problem List   Diagnosis Date Noted  . Encounter for induction of labor 05/09/2020  . Intrahepatic cholestasis of pregnancy, antepartum 03/24/2020  . Pruritus of pregnancy in third trimester 03/08/2020  . History of COVID-19 02/22/2020  . Myalgia   . Genital herpes affecting pregnancy, antepartum 11/08/2019  .  Supervision of high risk pregnancy, antepartum 10/31/2019  . Herpes genitalis in women 10/31/2019  . Asthma     Assessment/Plan:  Jasmine Thornton is a 24 y.o. G1P0 at 37w1dhere for IOL for cholestasis.  #Labor: Vertex by exam. Will give Cytotec. Foley bulb placed manually and filled with 60 mL water; patient tolerated well. Pit/AROM PRN. Anticipate SVD. #Pain: Per patient request #FWB: Cat I; EFW: 3100g #ID:  GBS neg #MOF: Breast #MOC: PP IUD; ordered and consented #Circ:  NA; girl #Cholestasis: Diagnosed by mildly elevated bile acids and LFTs. Will repeat CMP today. Cont Ursodiol until  delivery. AST 54 and ALT 119 on 04/05/20. #Hx genital herpes: Reports compliance with Valtrex. No lesions on exam. No prodromal symptoms.  #Maternal Asthma: Has not used inhaler for years. PRN Albuterol ordered. Avoid Hemabate.   Barrington Ellison, MD [facility] Family Medicine Fellow, [facility], [facility] 05/09/2020, 12:51 AM

## 2020-05-09 NOTE — Discharge Instructions (Signed)

## 2020-05-09 NOTE — Anesthesia Procedure Notes (Signed)
Epidural Patient location during procedure: OB Start time: 05/09/2020 10:36 AM End time: 05/09/2020 10:44 AM  Staffing Anesthesiologist: Cecile Hearing, MD Performed: anesthesiologist   Preanesthetic Checklist Completed: patient identified, IV checked, risks and benefits discussed, monitors and equipment checked, pre-op evaluation and timeout performed  Epidural Patient position: sitting Prep: DuraPrep Patient monitoring: blood pressure and continuous pulse ox Approach: midline Location: L2-L3 Injection technique: LOR air  Needle:  Needle type: Tuohy  Needle gauge: 17 G Needle length: 9 cm Needle insertion depth: 5 cm Catheter size: 19 Gauge Catheter at skin depth: 10 cm Test dose: negative and Other (1% Lidocaine)  Additional Notes Patient identified.  Risk benefits discussed including failed block, incomplete pain control, headache, nerve damage, paralysis, blood pressure changes, nausea, vomiting, reactions to medication both toxic or allergic, and postpartum back pain.  Patient expressed understanding and wished to proceed.  All questions were answered.  Sterile technique used throughout procedure and epidural site dressed with sterile barrier dressing. No paresthesia or other complications noted. The patient did not experience any signs of intravascular injection such as tinnitus or metallic taste in mouth nor signs of intrathecal spread such as rapid motor block. Please see nursing notes for vital signs. Reason for block:procedure for pain

## 2020-05-09 NOTE — Anesthesia Preprocedure Evaluation (Signed)
Anesthesia Evaluation  Patient identified by MRN, date of birth, ID band Patient awake    Reviewed: Allergy & Precautions, NPO status , Patient's Chart, lab work & pertinent test results  Airway Mallampati: II  TM Distance: >3 FB Neck ROM: Full    Dental  (+) Teeth Intact, Dental Advisory Given   Pulmonary asthma ,  Covid + 02/22/20   Pulmonary exam normal breath sounds clear to auscultation       Cardiovascular negative cardio ROS Normal cardiovascular exam Rhythm:Regular Rate:Normal     Neuro/Psych  Headaches,    GI/Hepatic negative GI ROS, Cholestasis during pregnancy   Endo/Other  negative endocrine ROS  Renal/GU negative Renal ROS     Musculoskeletal negative musculoskeletal ROS (+)   Abdominal   Peds  (+) ADHD Hematology  (+) Blood dyscrasia, anemia , Platelet 212k   Anesthesia Other Findings Day of surgery medications reviewed with the patient.  Reproductive/Obstetrics (+) Pregnancy                             Anesthesia Physical Anesthesia Plan  ASA: II  Anesthesia Plan: Epidural   Post-op Pain Management:    Induction:   PONV Risk Score and Plan: 2 and Treatment may vary due to age or medical condition  Airway Management Planned: Natural Airway  Additional Equipment:   Intra-op Plan:   Post-operative Plan:   Informed Consent: I have reviewed the patients History and Physical, chart, labs and discussed the procedure including the risks, benefits and alternatives for the proposed anesthesia with the patient or authorized representative who has indicated his/her understanding and acceptance.     Dental advisory given  Plan Discussed with:   Anesthesia Plan Comments: (Patient identified. Risks/Benefits/Options discussed with patient including but not limited to bleeding, infection, nerve damage, paralysis, failed block, incomplete pain control, headache, blood  pressure changes, nausea, vomiting, reactions to medication both or allergic, itching and postpartum back pain. Confirmed with bedside nurse the patient's most recent platelet count. Confirmed with patient that they are not currently taking any anticoagulation, have any bleeding history or any family history of bleeding disorders. Patient expressed understanding and wished to proceed. All questions were answered. )        Anesthesia Quick Evaluation

## 2020-05-09 NOTE — Progress Notes (Signed)
Labor Progress Note Jasmine Thornton is a 24 y.o. G1P0 at [redacted]w[redacted]d presented for IOL for cholestasis. S: Feeling frequent ctx.  O:  BP 117/74   Pulse (!) 102   Temp 98.1 F (36.7 C) (Oral)   Resp 18   Ht 5\' 2"  (1.575 m)   Wt 72.4 kg   LMP 08/23/2019 (Exact Date) Comment: Gravidia 1, Para 0, AB 0  BMI 29.19 kg/m  EFM: 135, moderate variability, pos accels, no decels, reactive TOCO: q2-35m  CVE: Dilation: 4 Effacement (%): 70 Station: -2 Presentation: Vertex Exam by:: B McClam, RN    [name]: 24 y.o. G1P0 [redacted]w[redacted]d here for IOL for cholestasis. #Labor: Progressing well. S/p FB and Cyto x1. Feeling frequent ctx; will give a few hours to see if she makes change on her own and then consider Pitocin. AROM PRN. Anticipate SVD. #Pain: per patient request #FWB: Cat I #GBS negative #Cholestasis: Diagnosed by mildly elevated bile acids and LFTs. Will repeat CMP today. Cont Ursodiol until delivery. AST 54 and ALT 119 on 04/05/20. AST now 49 and ALT now 60.  04/07/20, MD 6:15 AM

## 2020-05-09 NOTE — Procedures (Signed)
  Post-Placental IUD Insertion Procedure Note  Patient identified, informed consent signed prior to delivery, signed copy in chart, time out was performed.    Vaginal, labial and perineal areas thoroughly inspected for lacerations. 2nd degree laceration identified - not repaired prior to insertion of IUD.  Liletta  - IUD grasped with sterile ring forceps. Fundus identified through abdominal wall using non-insertion hand. IUD inserted to fundus with ring forceps. IUD carefully released at the fundus and ring forceps gently removed from vagina.    Strings trimmed to the level of the introitus. Patient tolerated procedure well.  Lot # [id number] Expiration Date 10/11/2023  Patient given post procedure instructions and IUD care card with expiration date.  Patient is asked to keep IUD strings tucked in her vagina until her postpartum follow up visit in 4-6 weeks. Patient advised to abstain from sexual intercourse and pulling on strings before her follow-up visit. Patient verbalized an understanding of the plan of care and agrees.   Marlowe Alt, DO OB Fellow, Faculty Practice 05/09/2020 3:32 PM

## 2020-05-09 NOTE — Discharge Summary (Signed)
Postpartum Discharge Summary      Patient Name: Jasmine Thornton DOB: Feb 24, 1996 MRN: [id number]  Date of admission: 05/09/2020 Delivery date:05/09/2020  Delivering provider: Merilyn Baba  Date of discharge: 05/11/2020  Admitting diagnosis: Encounter for induction of labor [Z34.90] Intrauterine pregnancy: [redacted]w[redacted]d    Secondary diagnosis:  Active Problems:   Asthma   Herpes genitalis in women   Genital herpes affecting pregnancy, antepartum   History of COVID-19   Pruritus of pregnancy in third trimester   Intrahepatic cholestasis of pregnancy, antepartum   Encounter for induction of labor  Additional problems: none    Discharge diagnosis: Term Pregnancy Delivered                                              Post partum procedures: post placental IUD Augmentation: Pitocin, Cytotec and IP Foley Complications: None  Hospital course: Induction of Labor With Vaginal Delivery   24y.o. yo G1P0 at 360w1das admitted to the hospital 05/09/2020 for induction of labor.  Indication for induction: Cholestasis of pregnancy.  Patient had an uncomplicated labor course as follows:  Induction was started with FB and cytotec. She progressed well, eventually needing minimal augmentation with pitocin. She SROMed and progressed to complete, delivering after a short second stage.  Membrane Rupture Time/Date: 12:44 PM ,05/09/2020   Delivery Method:Vaginal, Spontaneous  Episiotomy: None  Lacerations:  Periurethral;2nd degree;Perineal  Details of delivery can be found in separate delivery note.  Patient had a routine postpartum course. Patient is discharged home 05/11/20.  Newborn Data: Birth date:05/09/2020  Birth time:2:55 PM  Gender:Female  Living status:Living  Apgars:9 ,9  Weight:2550 g   Magnesium Sulfate received: No BMZ received: No Rhophylac:N/A MMR:N/A T-DaP:Given prenatally Flu: N/A Transfusion:No  Physical exam  Vitals:   05/10/20 0600 05/10/20 1603 05/10/20 2207 05/11/20  0556  BP: 103/63 (!) 91/52 116/85 115/69  Pulse: 85 82 99 73  Resp: _0 Temp: 98 F (36.7 C) 98.5 F (36.9 C) 98.5 F (36.9 C) 98.7 F (37.1 C)  TempSrc: Oral Oral Oral Oral  SpO2: 100%  100%   Weight:      Height:       General: alert, cooperative and no distress Lochia: appropriate Uterine Fundus: firm Incision: N/A DVT Evaluation: No evidence of DVT seen on physical exam. Negative Homan's sign. No cords or calf tenderness. Labs: Lab Results  Component Value Date   WBC 17.0 (H) 05/10/2020   HGB 9.9 (L) 05/10/2020   HCT 29.5 (L) 05/10/2020   MCV 95.5 05/10/2020   PLT 240 05/10/2020   CMP Latest Ref Rng & Units 05/09/2020  Glucose 70 - 99 mg/dL 71  BUN 6 - 20 mg/dL 7  Creatinine 0.44 - 1.00 mg/dL 0.69  Sodium 135 - 145 mmol/L 135  Potassium 3.5 - 5.1 mmol/L 4.4  Chloride 98 - 111 mmol/L 108  CO2 22 - 32 mmol/L 18(L)  Calcium 8.9 - 10.3 mg/dL 8.7(L)  Total Protein 6.5 - 8.1 g/dL 6.2(L)  Total Bilirubin 0.3 - 1.2 mg/dL 1.1  Alkaline Phos 38 - 126 U/L 114  AST 15 - 41 U/L 49(H)  ALT 0 - 44 U/L 60(H)   EdFlavia Shippercore: Edinburgh Postnatal Depression Scale Screening Tool 05/10/2020  I have been able to laugh and see the funny side of things. 0  I have  looked forward with enjoyment to things. 0  I have blamed myself unnecessarily when things went wrong. 0  I have been anxious or worried for no good reason. 0  I have felt scared or panicky for no good reason. 0  Things have been getting on top of me. 1  I have been so unhappy that I have had difficulty sleeping. 0  I have felt sad or miserable. 0  I have been so unhappy that I have been crying. 0  The thought of harming myself has occurred to me. 0  Edinburgh Postnatal Depression Scale Total 1     After visit meds:  Allergies as of 05/11/2020   No Known Allergies     Medication List    STOP taking these medications   albuterol 108 (90 Base) MCG/ACT inhaler Commonly known as: VENTOLIN HFA   Blood  Pressure Kit Devi   hydrOXYzine 25 MG tablet Commonly known as: ATARAX/VISTARIL   prenatal multivitamin Tabs tablet   ursodiol 500 MG tablet Commonly known as: ACTIGALL   valACYclovir 500 MG tablet Commonly known as: Valtrex     TAKE these medications   ibuprofen 600 MG tablet Commonly known as: ADVIL Take 1 tablet (600 mg total) by mouth every 6 (six) hours.        Discharge home in stable condition Infant Feeding: No evidence of DVT seen on physical exam. Negative Homan's sign. No cords or calf tenderness. Infant Disposition:home with mother Discharge instruction: per After Visit Summary and Postpartum booklet. Activity: Advance as tolerated. Pelvic rest for 6 weeks.  Diet: routine diet Future Appointments:No future appointments. Follow up Visit:  Follow-up Information    [facility]. Schedule an appointment as soon as possible for a visit in 4 week(s).   Why: for postpartum checkup and IUD string check Contact information: [place]               Please schedule this patient for a In person postpartum visit in 4 weeks with the following provider: Any provider. Additional Postpartum F/U: IUD string check  Low risk pregnancy complicated by: cholestasis of pregnancy Delivery mode:  Vaginal, Spontaneous  Anticipated Birth Control:  PP IUD placed   05/11/2020 Christin Fudge, CNM

## 2020-05-10 LAB — CBC
HCT: 29.5 % — ABNORMAL LOW (ref 36.0–46.0)
Hemoglobin: 9.9 g/dL — ABNORMAL LOW (ref 12.0–15.0)
MCH: 32 pg (ref 26.0–34.0)
MCHC: 33.6 g/dL (ref 30.0–36.0)
MCV: 95.5 fL (ref 80.0–100.0)
Platelets: 240 10*3/uL (ref 150–400)
RBC: 3.09 MIL/uL — ABNORMAL LOW (ref 3.87–5.11)
RDW: 13.4 % (ref 11.5–15.5)
WBC: 17 10*3/uL — ABNORMAL HIGH (ref 4.0–10.5)
nRBC: 0 % (ref 0.0–0.2)

## 2020-05-10 NOTE — Anesthesia Postprocedure Evaluation (Signed)
Anesthesia Post Note  Patient: Jasmine Thornton  Procedure(s) Performed: AN AD HOC LABOR EPIDURAL     Patient location during evaluation: Mother Baby Anesthesia Type: Epidural Level of consciousness: awake and alert Pain management: pain level controlled Vital Signs Assessment: post-procedure vital signs reviewed and stable Respiratory status: spontaneous breathing, nonlabored ventilation and respiratory function stable Cardiovascular status: stable Postop Assessment: no headache, no backache and epidural receding Anesthetic complications: no   No complications documented.  Last Vitals:  Vitals:   05/09/20 2131 05/10/20 0600  BP: 116/75 103/63  Pulse: 92 85  Resp: 20 18  Temp: 36.8 C 36.7 C  SpO2: 100% 100%    Last Pain:  Vitals:   05/10/20 0600  TempSrc: Oral  PainSc: 0-No pain   Pain Goal:                   Jasmine Thornton

## 2020-05-10 NOTE — Progress Notes (Signed)
Post Partum Day #1 Subjective: no complaints, up ad lib and tolerating PO; breastfeeding going well; had IUD placed postplacentally and hasn't seen it come out  Objective: Blood pressure 103/63, pulse 85, temperature 98 F (36.7 C), temperature source Oral, resp. rate 18, height 5\' 2"  (1.575 m), weight 72.4 kg, last menstrual period 08/23/2019, SpO2 100 %, unknown if currently breastfeeding.  Physical Exam:  General: alert, cooperative and no distress Lochia: appropriate Uterine Fundus: firm DVT Evaluation: No evidence of DVT seen on physical exam.  Recent Labs    05/09/20 0029 05/10/20 0514  HGB 10.6* 9.9*  HCT 32.7* 29.5*    Assessment/Plan: Plan for discharge tomorrow   LOS: 1 day   07/11/20 05/10/2020, 7:12 AM

## 2020-05-11 MED ORDER — IBUPROFEN 600 MG PO TABS: 600.0000 mg | ORAL_TABLET | Freq: Four times a day (QID) | ORAL | 0 refills | Status: AC

## 2020-05-11 NOTE — Lactation Note (Signed)
This note was copied from a baby's chart. Lactation Consultation Note  Patient Name: Jasmine Thornton [id number] Date: 05/11/2020 Reason for consult: Follow-up assessment;Early term 37-38.6wks;1st time breastfeeding;Infant weight loss P1, 33 hour ETI infant cluster feeding at this time, weight loss -5%. Per mom, infant is latching well at breast her concern if infant is getting enough, mom is currently supplementing with donor breast milk.  Per mom, she has been pumping DEBP  prior to latching infant at breast LC ask mom to pump after latching infant.  Mom has been wipe colostrum that is expressed off pump, LC reviewed importance of colostrum and for mom to finger feed her EBM to infant and if larger volume to spoon feed or bottle feed it to infant.  LC did not observe latch mom had finished just prior to [facility] entering the room. Mom has been giving infant 20 mls of donor milk after latching infant at breast per feeding. Mom will continue to BF according hunger cues, 8 to 12+ times within 24 hours and not exceed 3 hours without BF infant. Mom's plan: 1. BF according to cues, 8 + times within 24 hours. 2.Mom will use DEBP after latching infant at breast not before and while mom is pumping, grandmother will supplement infant with donor breast milk using a slow flow bottle nipple. 3. Mom will ask RN pr LC for assistance with latch if needed.   Maternal Data    Feeding Feeding Type: Donor Breast Milk Nipple Type: Extra Slow Flow  LATCH Score Latch: Repeated attempts needed to sustain latch, nipple held in mouth throughout feeding, stimulation needed to elicit sucking reflex.  Audible Swallowing: Spontaneous and intermittent  Type of Nipple: Everted at rest and after stimulation  Comfort (Breast/Nipple): Soft / non-tender  Hold (Positioning): Assistance needed to correctly position infant at breast and maintain latch.  LATCH Score: 8  Interventions Interventions: Skin to skin;Adjust  position;Breast compression;Assisted with latch;Hand express  Lactation Tools Discussed/Used     Consult Status Consult Status: Follow-up Date: 05/12/20 Follow-up type: In-patient    Danelle Earthly 05/11/2020, 12:03 AM

## 2020-05-11 NOTE — Lactation Note (Signed)
This note was copied from a baby's chart. Lactation Consultation Note  Patient Name: Jasmine Thornton [id number] Date: 05/11/2020 Reason for consult: Follow-up assessment;Early term 37-38.6wks;Infant < 6lbs   Baby 43 hours old.  [redacted]w[redacted]d < 6 lbs.  Baby on phototherapy. Baby has been latching briefly periodically per mother and being supplemented with mother's milk and donor milk. Attempted latching but baby tired easily and did not sustain latch.  Mother recently pumped approx 4 ml.  Mother wanted to spoon feed to infant but infant was too sleepy so LC demonstrated how to give via finger syringe.  Volume too small for bottle.  Assisted mother with giving donor milk with slow flow nipple.  Demonstrated how to pace feed and initiate sucking via tug of war with bottle. Discussed not warming milk with water that is too hot.  Larger volume of donor milk has been ordered.  Mother states her DEBP is on it's way to her home.    Maternal Data Has patient been taught Hand Expression?: Yes  Feeding Feeding Type: Donor Breast Milk Nipple Type: Extra Slow Flow  LATCH Score                   Interventions Interventions: Breast feeding basics reviewed;Assisted with latch;DEBP;Hand express  Lactation Tools Discussed/Used     Consult Status Consult Status: Follow-up Date: 05/12/20 Follow-up type: In-patient    Dahlia Byes [facility] 05/11/2020, 10:40 AM

## 2020-05-12 ENCOUNTER — Ambulatory Visit: Payer: Self-pay

## 2020-05-12 NOTE — Lactation Note (Addendum)
This note was copied from a baby's chart. Lactation Consultation Note  Patient Name: Jasmine Thornton [id number] Date: 05/12/2020 Reason for consult: Follow-up assessment   Mother is a P67, infant is 71 hours old and is now at 5 % wt loss. Infant is 37.1  .  Mother reports that she has order an electric pump that should be arriving in mail today. Mother has a manual pump . Advised mother to pump before leaving the hospital.  Mother would like to see [facility] for follow up.  Mothers milk is coming in . Mother is using donor milk. Infant has been going to the breast .  Discussed using the Dr Manson Passey premmie nipple from [facility]. Mother to see Peds on Tuesday.   Mother to continue to cue base feed infant and feed at least 8-12 times or more in 24 hours and advised to allow for cluster feeding infant as needed.   Mother to continue to due STS. Mother is aware of available LC services at [facility], BFSG'S, OP Dept, and phone # for questions or concerns about breastfeeding.  Mother receptive to all teaching and plan of care.     Maternal Data    Feeding    LATCH Score                   Interventions    Lactation Tools Discussed/Used     Consult Status Consult Status: Complete    Michel Bickers 05/12/2020, 11:24 AM

## 2020-05-30 NOTE — Progress Notes (Signed)
Patient ID: Jasmine Thornton, female   DOB: Apr 05, 1996, 24 y.o.   MRN: [id number] Patient was assessed and managed by nursing staff during this encounter. I have reviewed the chart and agree with the documentation and plan. I have also made any necessary editorial changes.  Scheryl Darter, MD 05/30/2020 1:18 PM

## 2020-06-12 ENCOUNTER — Encounter: Payer: Self-pay | Admitting: Nurse Practitioner

## 2020-06-12 DIAGNOSIS — Z975 Presence of (intrauterine) contraceptive device: Secondary | ICD-10-CM | POA: Insufficient documentation

## 2020-06-13 ENCOUNTER — Ambulatory Visit (INDEPENDENT_AMBULATORY_CARE_PROVIDER_SITE_OTHER): Payer: Medicaid Other | Admitting: Nurse Practitioner

## 2020-06-13 ENCOUNTER — Other Ambulatory Visit: Payer: Self-pay

## 2020-06-13 ENCOUNTER — Encounter: Payer: Self-pay | Admitting: Nurse Practitioner

## 2020-06-13 DIAGNOSIS — A6009 Herpesviral infection of other urogenital tract: Secondary | ICD-10-CM

## 2020-06-13 DIAGNOSIS — Z30431 Encounter for routine checking of intrauterine contraceptive device: Secondary | ICD-10-CM

## 2020-06-13 NOTE — Progress Notes (Signed)
Subjective:     Jasmine Thornton is a 24 y.o. female who presents for a postpartum visit. She is 5 weeks postpartum following a spontaneous vaginal delivery. I have fully reviewed the prenatal and intrapartum course. The delivery was at [redacted]w[redacted]d gestational weeks by induction for cholestasis. Outcome: spontaneous vaginal delivery. Anesthesia: epidural. Postpartum course has been good. Baby's course has been good. Baby is feeding by breast. Bleeding no bleeding. Bowel function is normal. Bladder function is normal. Patient is not sexually active. Contraception method is IUD. Postpartum depression screening: negative.  The following portions of the patient's history were reviewed and updated as appropriate: allergies, current medications, past family history, past medical history, past social history, past surgical history and problem list.  Review of Systems Pertinent items noted in HPI and remainder of comprehensive ROS otherwise negative.   Objective:    LMP 08/23/2019 (Exact Date) Comment: Gravidia 1, Para 0, AB 0  General:  alert, cooperative and no distress   Breasts:  not examined - breastfeeding  Lungs: clear to auscultation bilaterally  Heart:  regular rate and rhythm, S1, S2 normal, no murmur, click, rub or gallop  Abdomen: soft, nontender   Vulva:  at 6 o'clock, area of lightly pink (darker than other vaginal tissue) and light cream suture still seen in the area  Vagina: normal vagina, no discharge, exudate, lesion, or erythema  Cervix:  nontender, IUD strings seen - very long and trimmed to 2-3 cm in length, no part of the IUD palpated on bimanual exam  Corpus: normal  Adnexa:  normal adnexa  Rectal Exam: Not performed.        Assessment:    normal postpartum exam. Pap smear not done at today's visit.  IUD strings trimmed Perineal 2nd degree still healing - nontender but slightly pink with dissolvable string still visible  Plan:    1. Contraception: IUD 2. Advised to take motrin  or ibuprofen or tylenol for any lower abdominal cramping which might occur with IUD 3. Follow up in: 1 year or as needed.   4.  Advised to delay intercourse for 2 weeks to allow for more complete perineal healing. 5.  Client has medication in case she has a HSV outbreak.  Advised if she has several outbreaks, she may want to start suppressive therapy and to notify the office for medication if she needs suppression. 6.  Advised to consider getting Covid vaccine.  Not a problem with breastfeeding.  Even though she has had Covid once, she is still at risk of getting the Delta variant, so the vaccine is still recommended.   Nolene Bernheim, RN, MSN, NP-BC Nurse Practitioner, [facility] for [employer], [facility] 06/13/2020 11:40 AM

## 2020-12-12 IMAGING — US US FETAL BPP W/ NON-STRESS
1 series · 13 of 14 positions shown · non-contrast
Comparison: none

[Series 1: us fetal bpp w/ non-stress · 14 acquisitions, 13 frames shown]
[im 1/14]
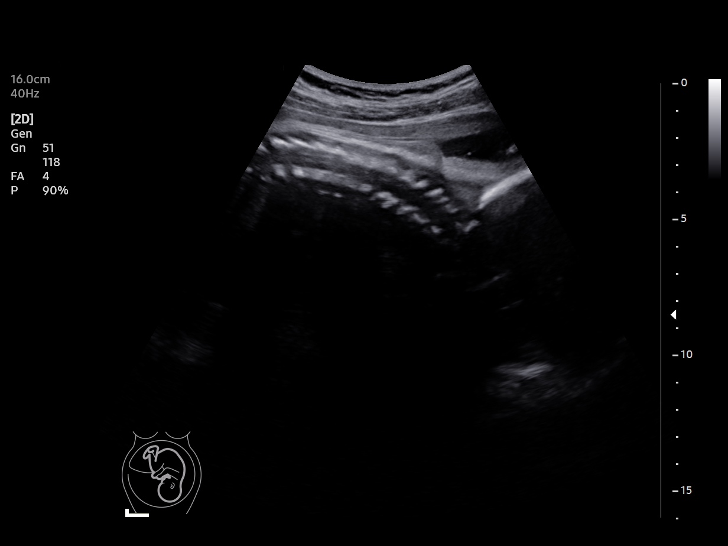
[im 2/14]
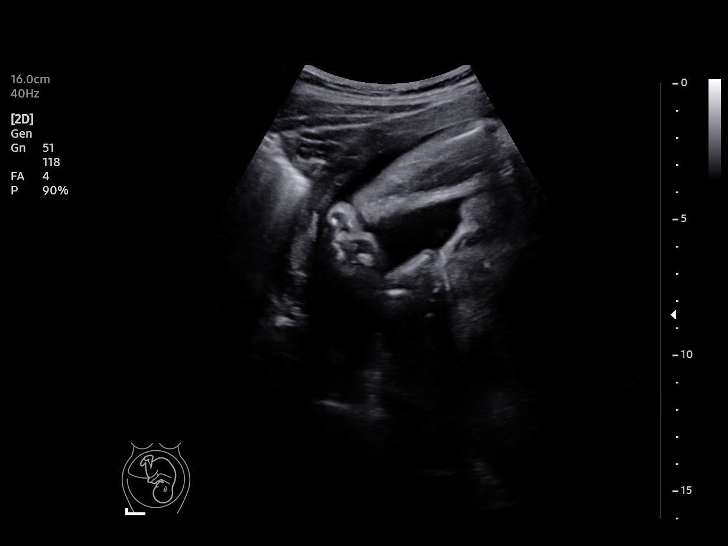
[im 3/14]
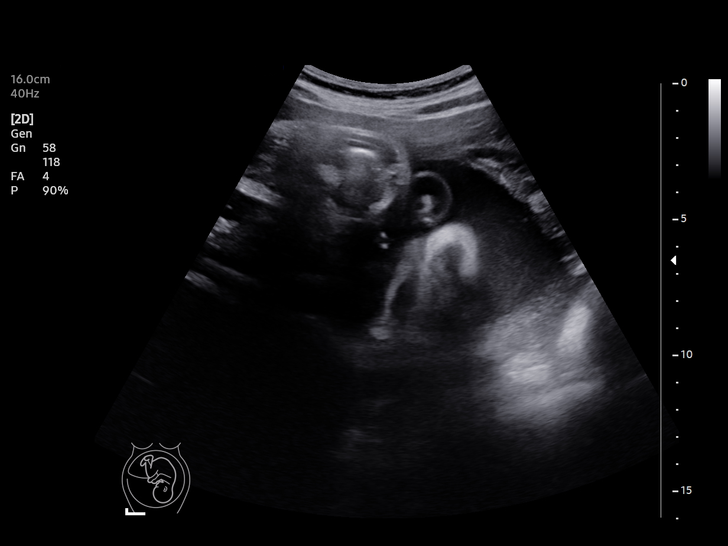
[im 4/14]
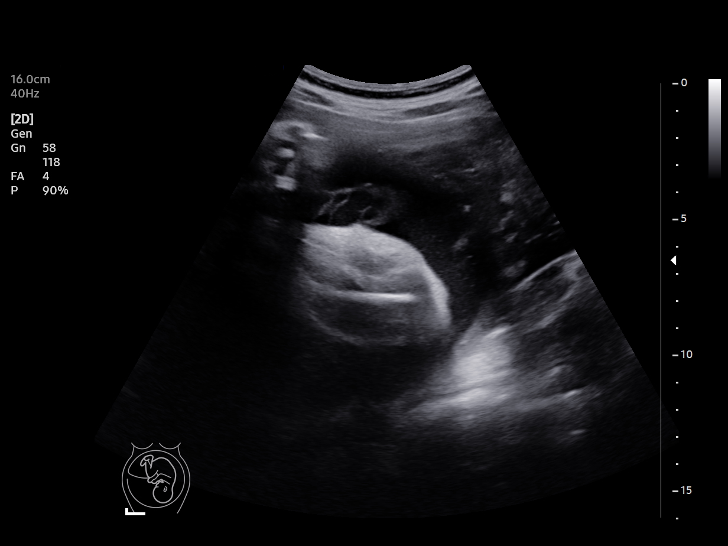
[im 5/14]
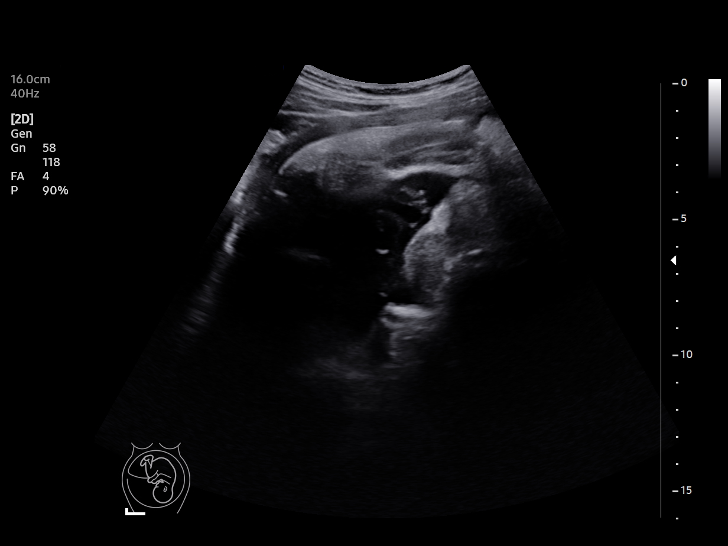
[im 6/14]
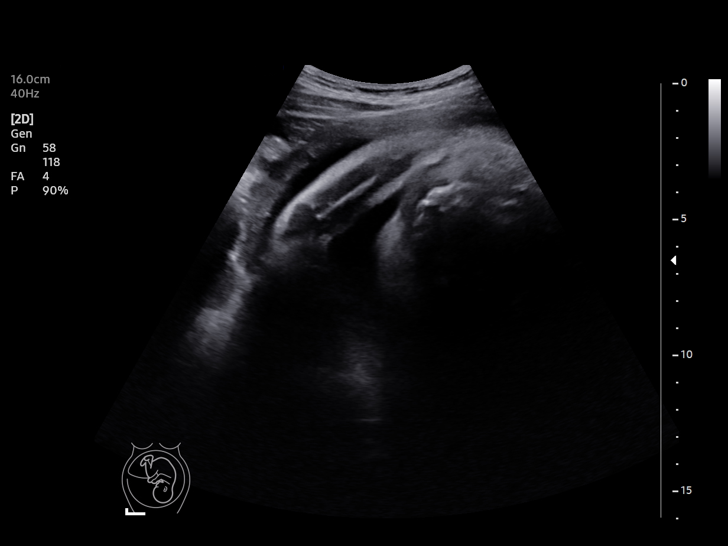
[im 8/14]
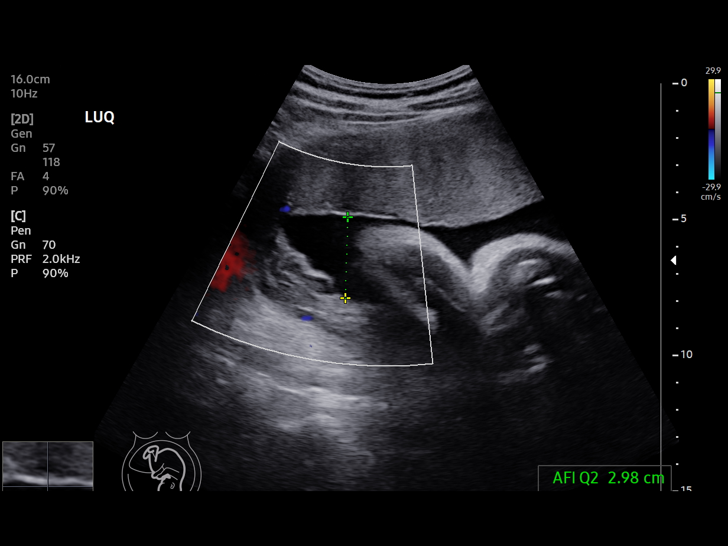
[im 9/14]
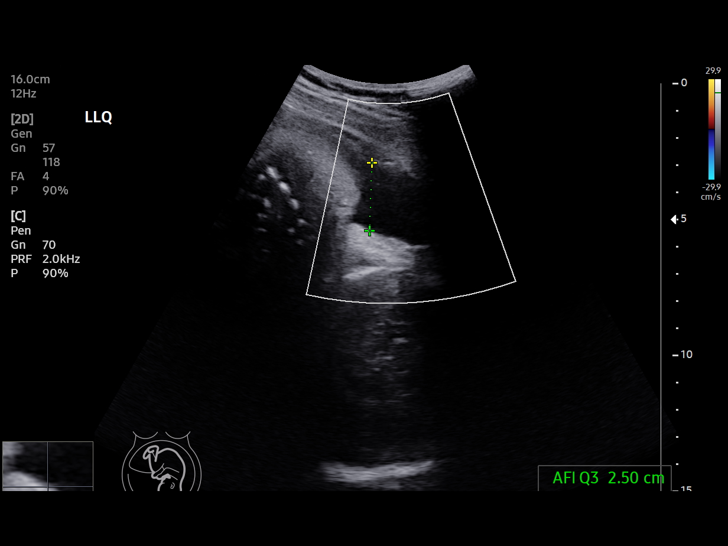
[im 10/14]
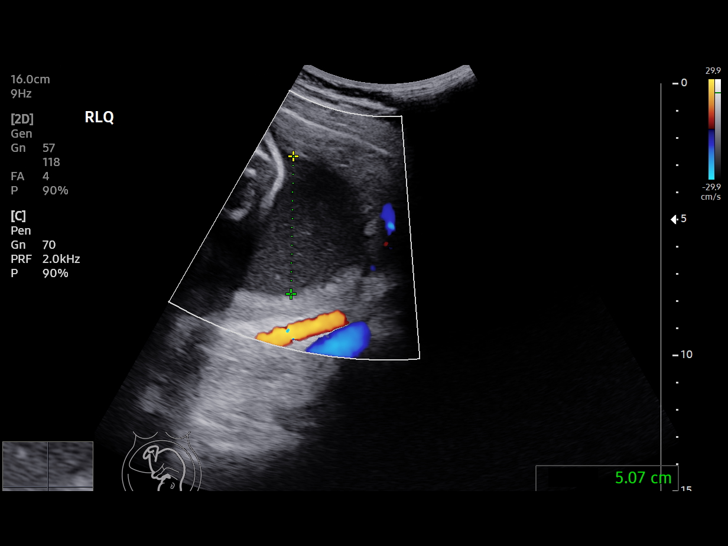
[im 11/14]
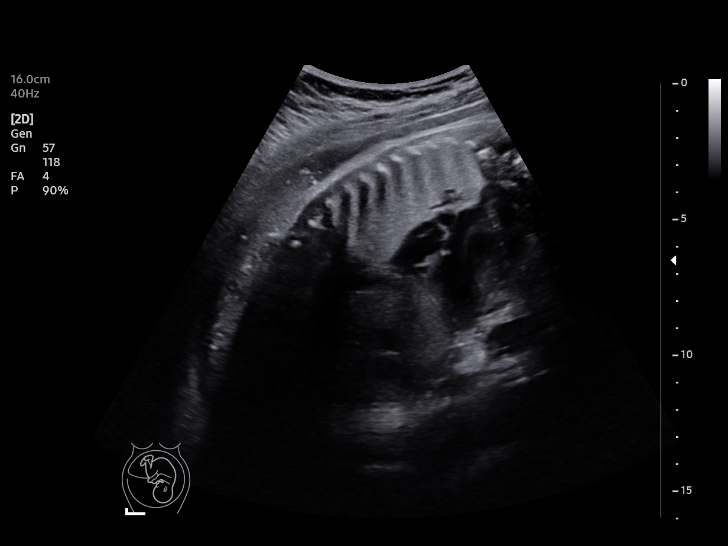
[im 12/14]
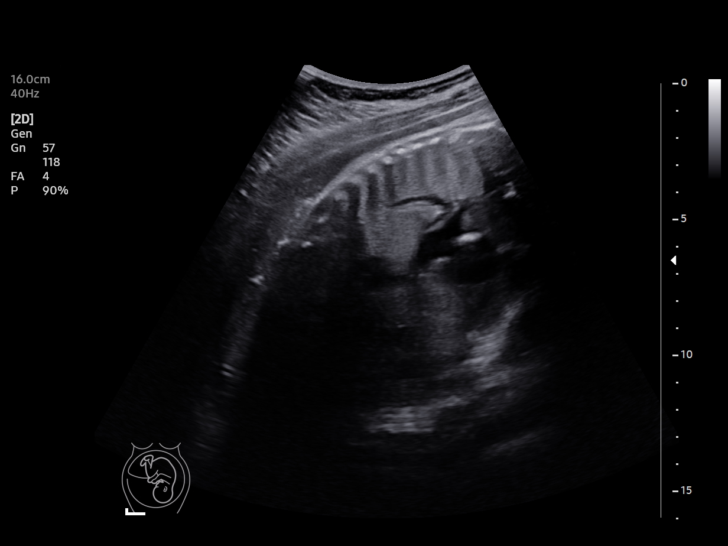
[im 13/14]
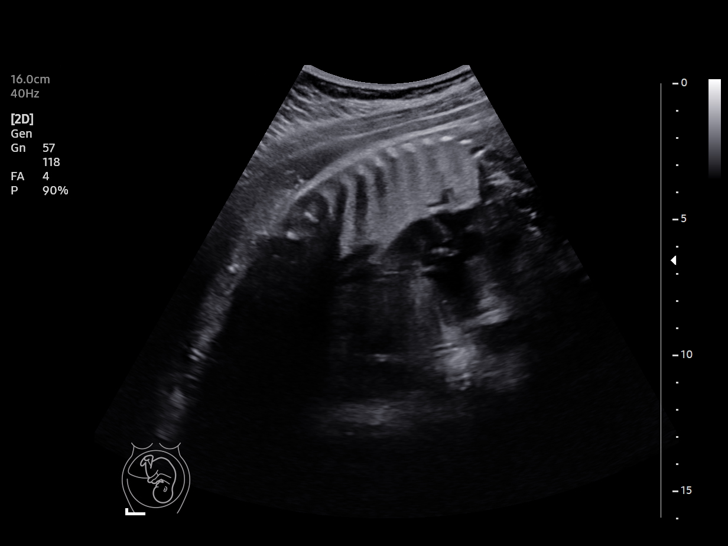
[im 14/14]
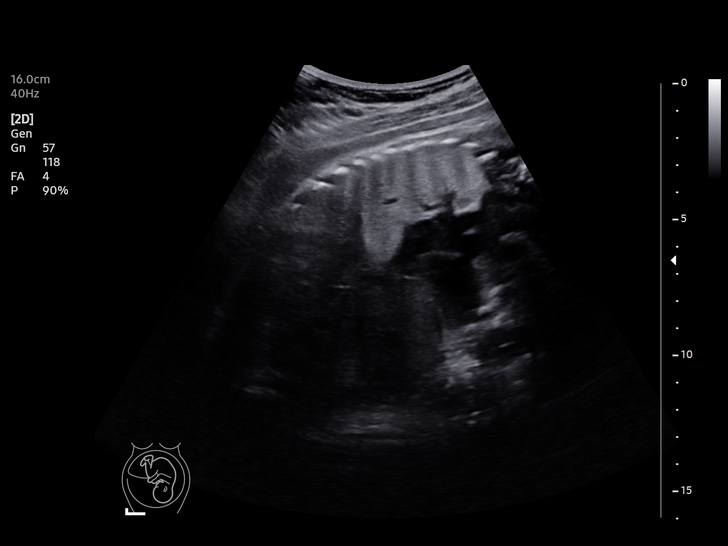

[13 of 14 positions shown; findings below may reference images not displayed]

[REDACTED]care at

 1  US FETAL BPP W/NONSTRESS              76818.4     URMILA TROUPE

Service(s) Provided

Indications

 34 weeks gestation of pregnancy
 Cholestasis of pregnancy, third trimester      [id number]
Vital Signs

                                                Height:        5'2"
Fetal Evaluation

 Num Of Fetuses:         1
 Preg. Location:         Intrauterine
 Cardiac Activity:       Observed
 Presentation:           Cephalic

 Amniotic Fluid
 AFI FV:      Within normal limits

 AFI Sum(cm)     %Tile       Largest Pocket(cm)
 12.7            39

 RUQ(cm)       RLQ(cm)       LUQ(cm)        LLQ(cm)
 2.1           5.1           3
Biophysical Evaluation

 Amniotic F.V:   Pocket => 2 cm             F. Tone:        Observed
 F. Movement:    Observed                   N.S.T:          Reactive
 F. Breathing:   Observed                   Score:          [DATE]
OB History

 Gravidity:    1
Gestational Age

 LMP:           34w 1d        Date:  08/23/19                 EDD:   05/29/20
 Best:          34w 1d     Det. By:  LMP  (08/23/19)          EDD:   05/29/20
Impression

 Reassuring antenatal testing
Recommendations

 Continue with weekly testing until delivery
                Boes, Rosaria

## 2020-12-20 IMAGING — US US FETAL BPP W/ NON-STRESS
1 series · 13 of 13 positions shown · non-contrast
Comparison: none

[Series 1: us fetal bpp w/ non-stress · 13 acquisitions, 13 frames shown]
[im 1/13]
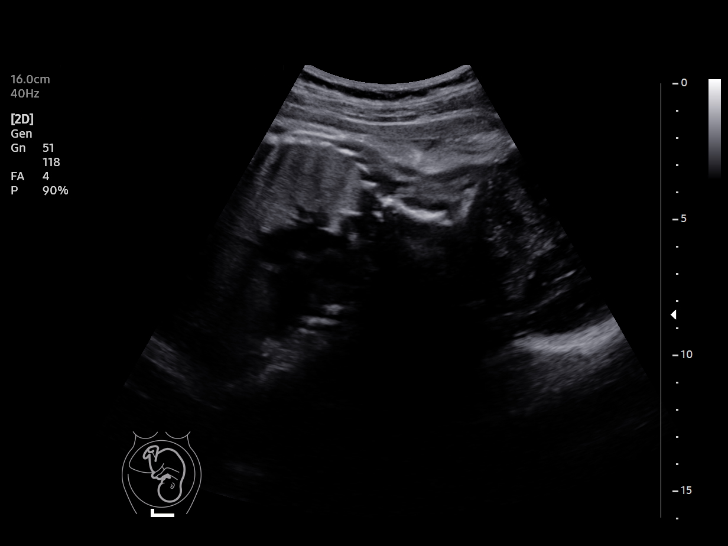
[im 2/13]
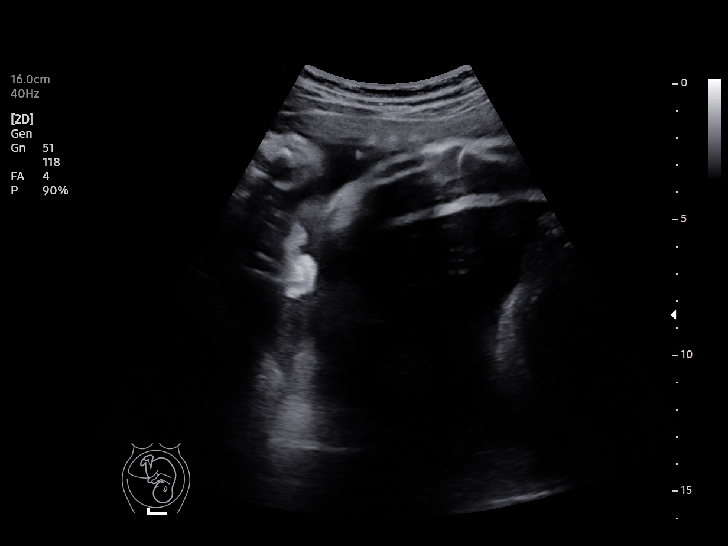
[im 3/13]
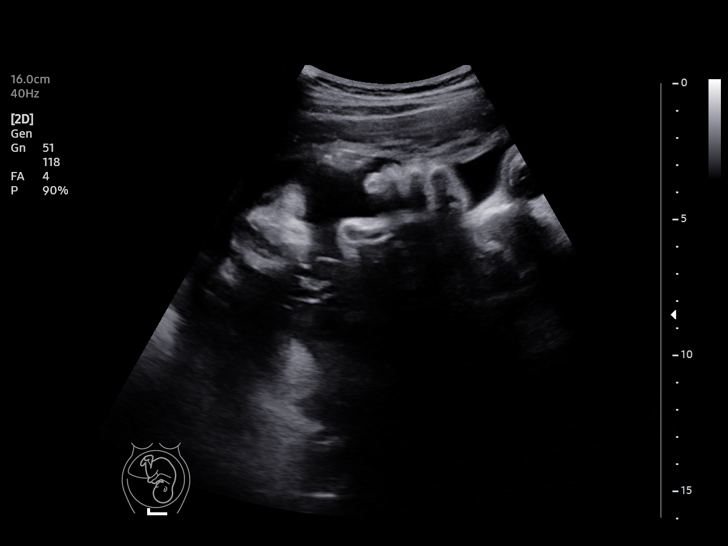
[im 4/13]
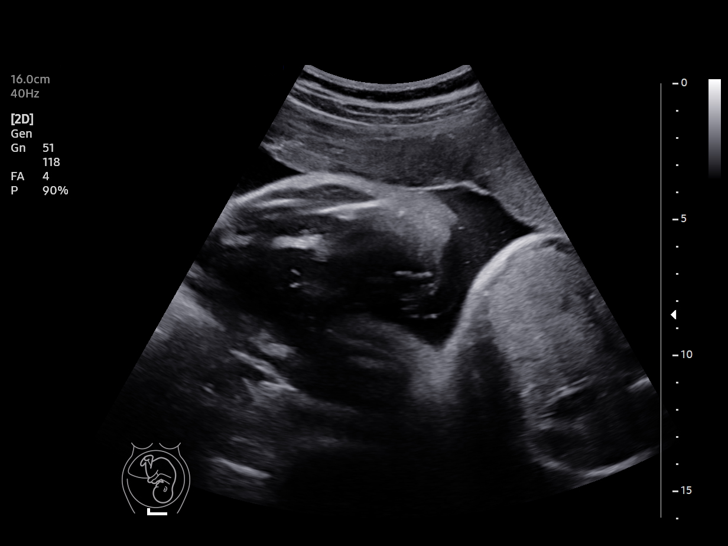
[im 5/13]
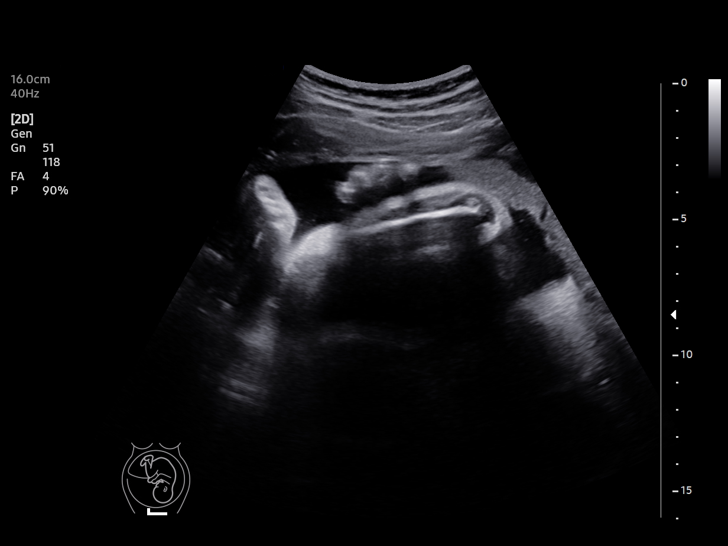
[im 6/13]
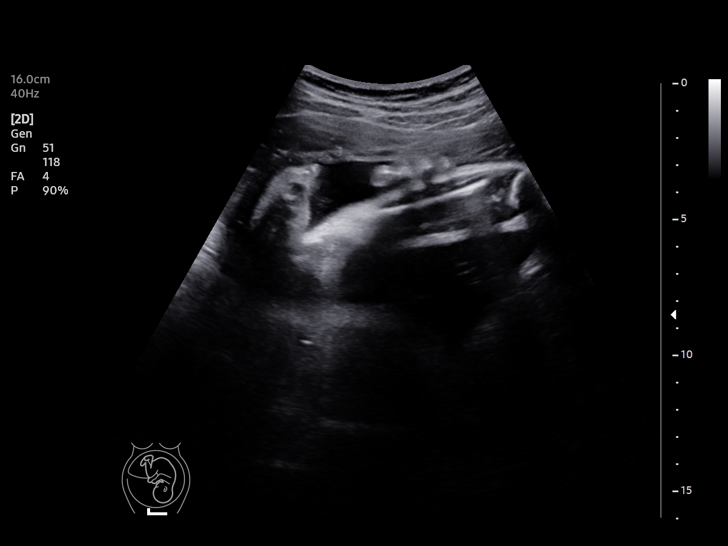
[im 7/13]
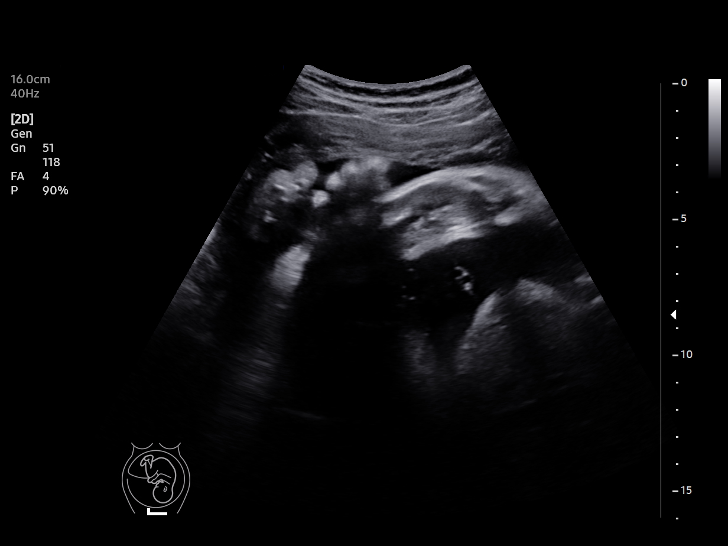
[im 8/13]
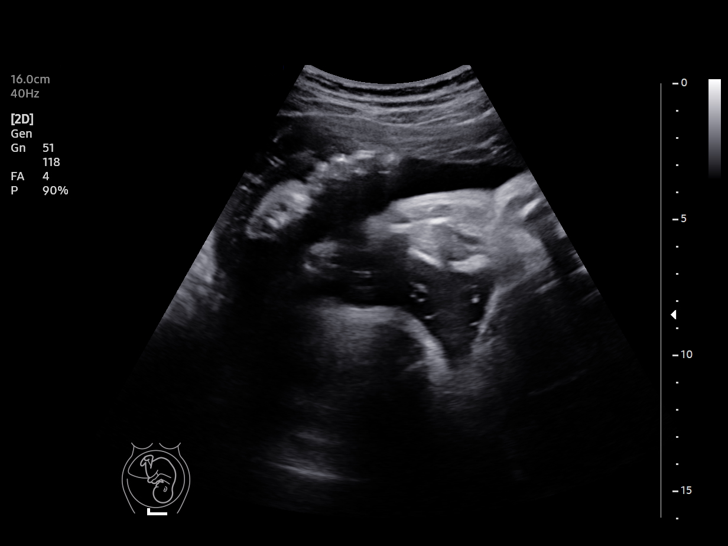
[im 9/13]
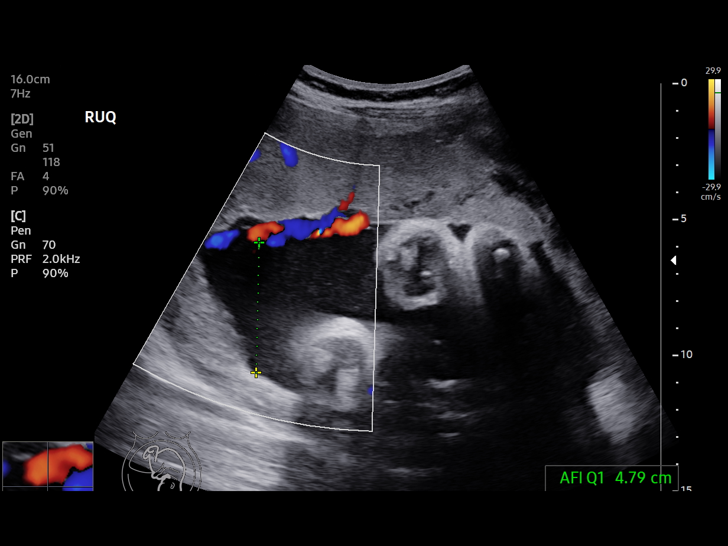
[im 10/13]
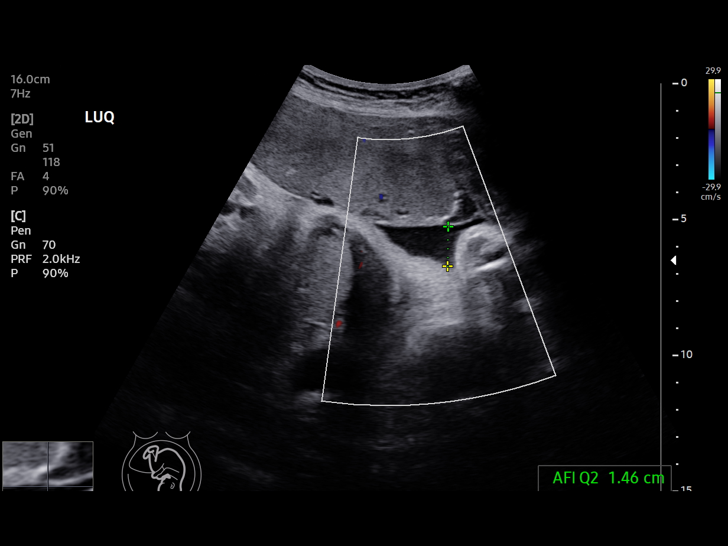
[im 11/13]
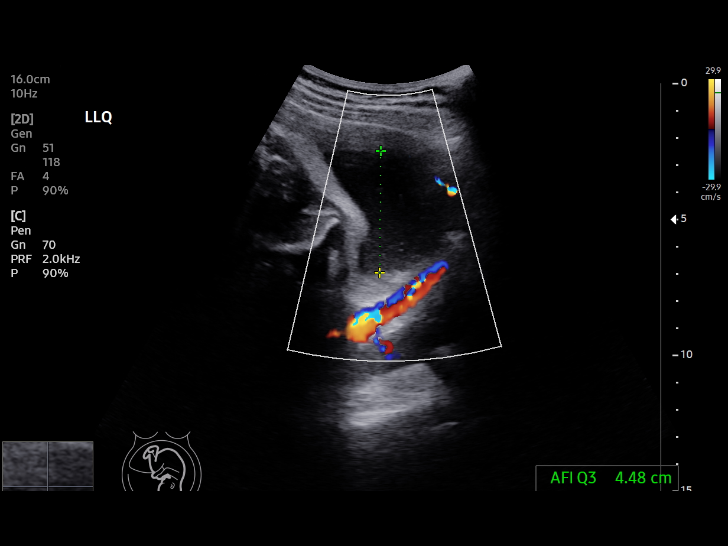
[im 12/13]
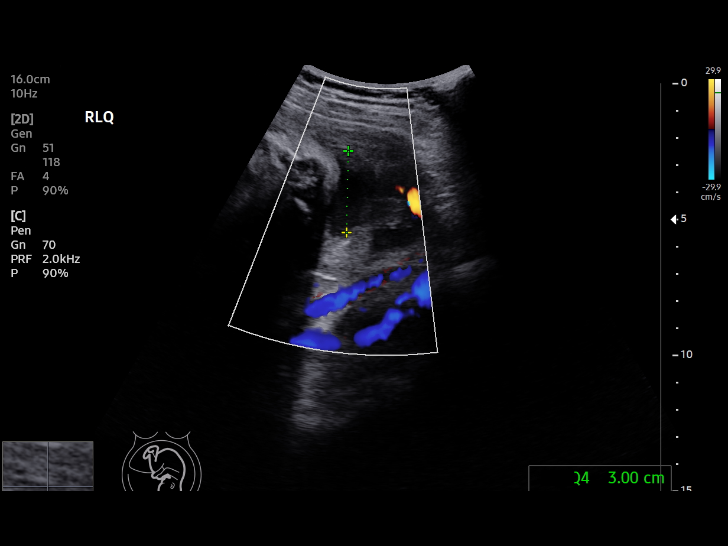
[im 13/13]
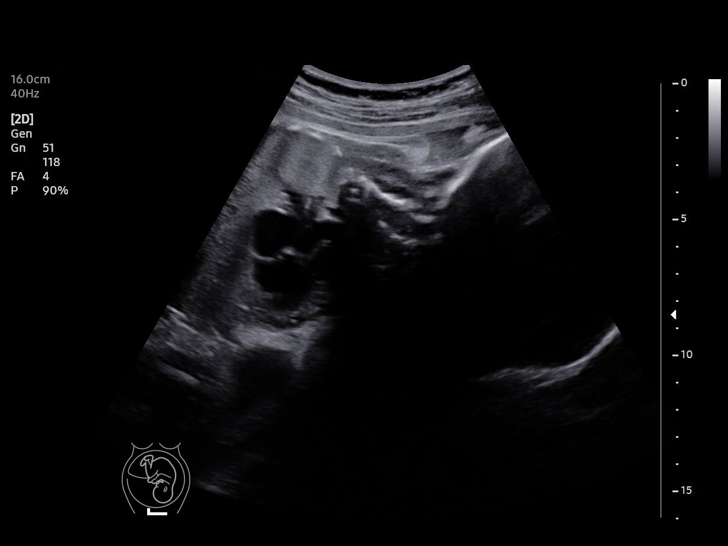

[13 of 13 positions shown; findings below may reference images not displayed]

[REDACTED]care at

 1  US FETAL BPP W/NONSTRESS              76818.4     DONGJU REGALADO

Service(s) Provided

Indications

 35 weeks gestation of pregnancy
 Cholestasis of pregnancy, third trimester       [id number]
Vital Signs

                                                Height:        5'2"
Fetal Evaluation

 Num Of Fetuses:          1
 Preg. Location:          Intrauterine
 Cardiac Activity:        Observed
 Presentation:            Cephalic

 Amniotic Fluid
 AFI FV:      Within normal limits

 AFI Sum(cm)     %Tile       Largest Pocket(cm)
 13.8            49

 RUQ(cm)       RLQ(cm)       LUQ(cm)        LLQ(cm)
 4.8           3
Biophysical Evaluation

 Amniotic F.V:   Pocket => 2 cm             F. Tone:         Observed
 F. Movement:    Observed                   N.S.T:           Reactive
 F. Breathing:   Observed                   Score:           [DATE]
OB History

 Gravidity:    1
Gestational Age

 LMP:           35w 2d        Date:  08/23/19                 EDD:    05/29/20
 Best:          35w 2d     Det. By:  LMP  (08/23/19)          EDD:    05/29/20
Impression

 Antenatal testing is reassuring with BPP [DATE].  Normal
 amniotic fluid volume.
Recommendations

 Continue weekly antenatal testing till delivery .
              Philpott, Singh

## 2021-02-07 IMAGING — US US MFM FETAL BPP W/O NON-STRESS
1 series · 13 of 28 positions shown · non-contrast
Comparison: none

[Series 1: us mfm fetal bpp w/o non-stress · 51 acquisitions, 13 frames shown]
[im 2/51]
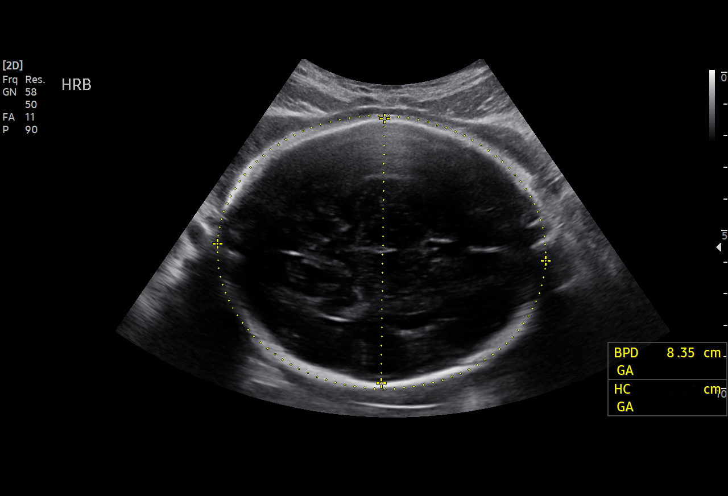
[im 6/51]
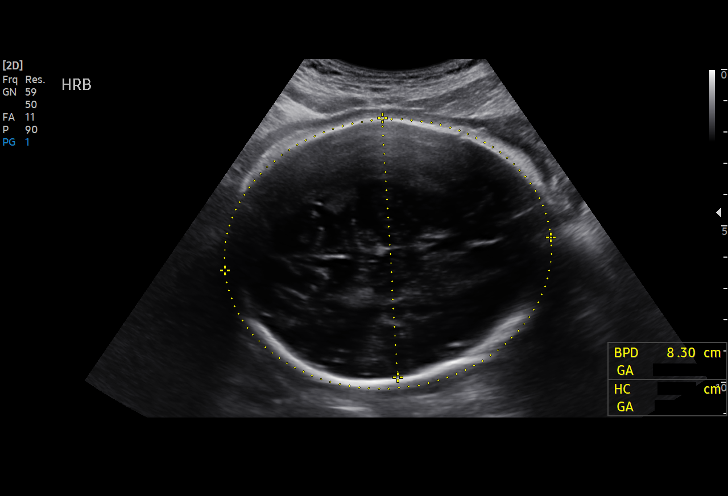
[im 10/51]
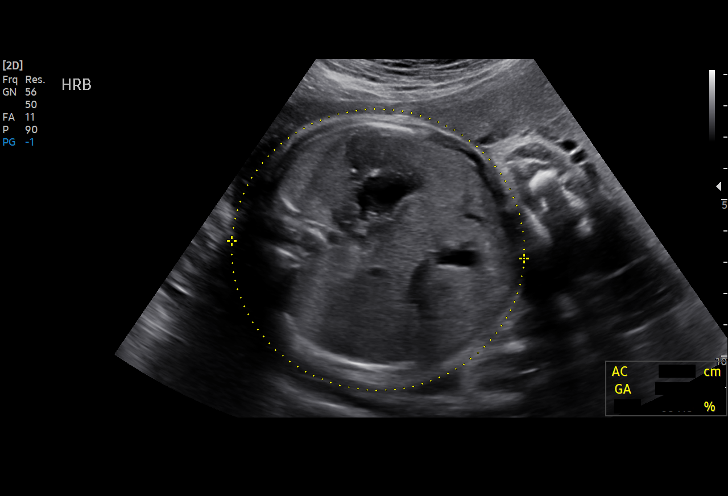
[im 13/51]
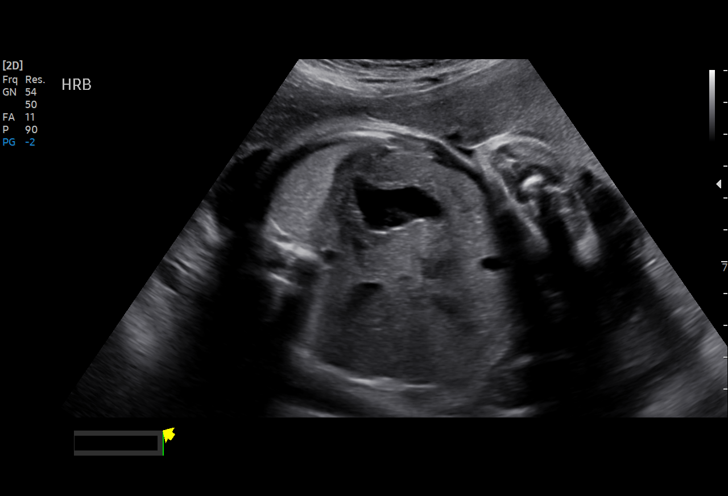
[im 17/51]
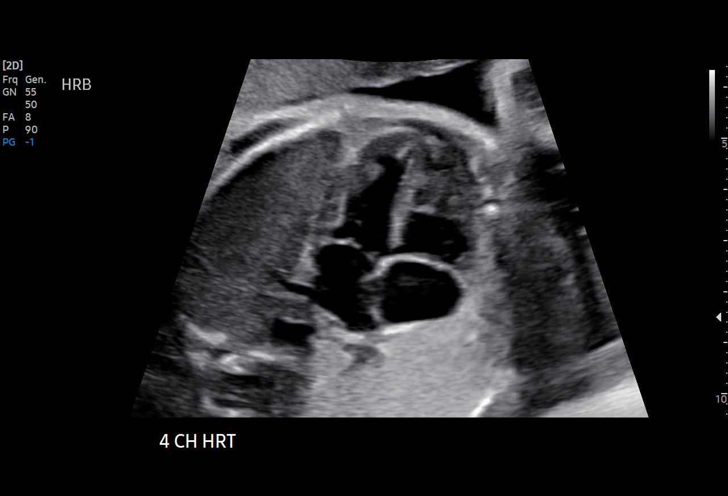
[im 21/51]
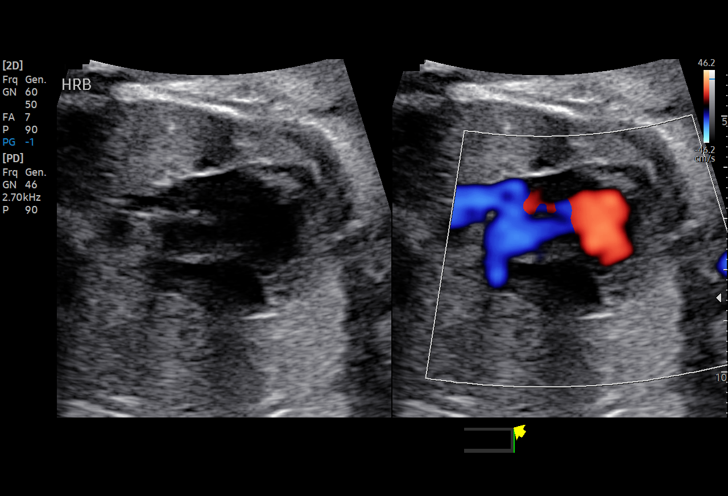
[im 26/51]
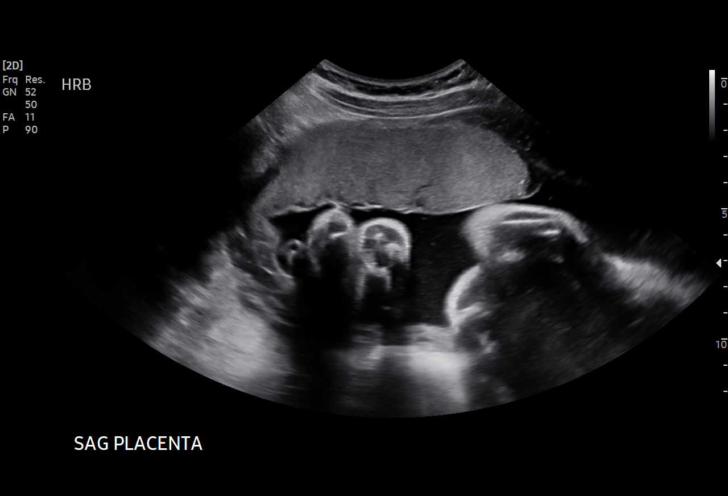
[im 30/51]
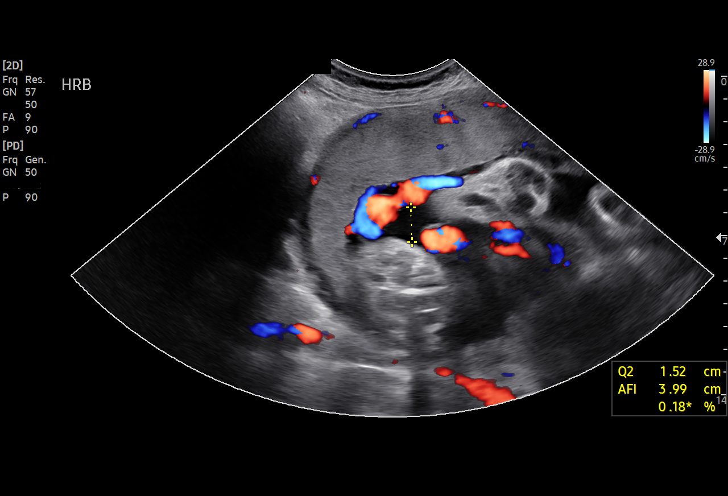
[im 34/51]
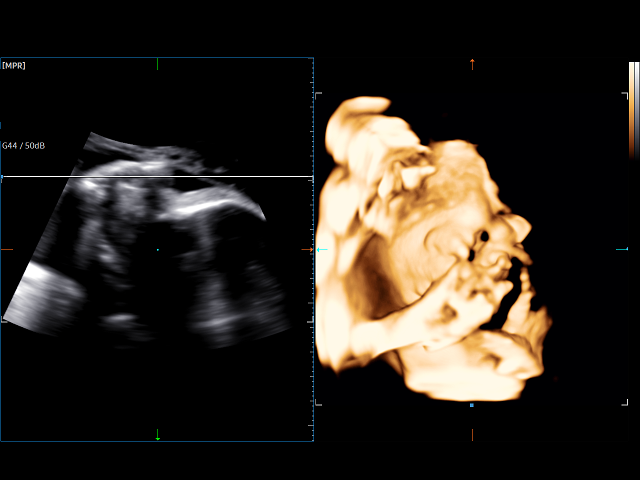
[im 38/51]
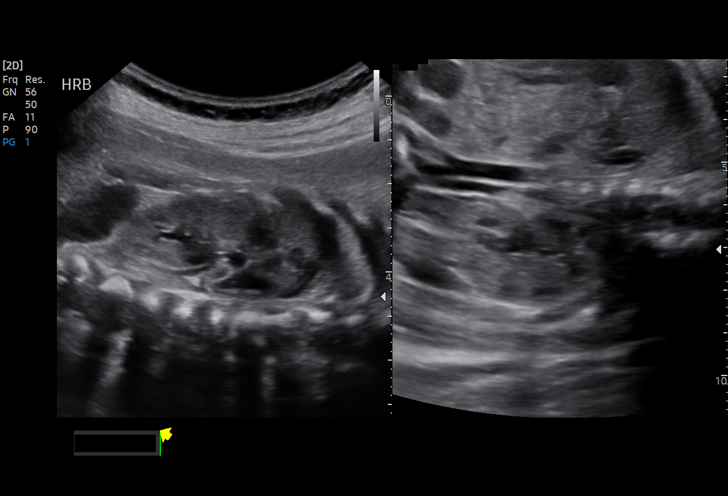
[im 41/51]
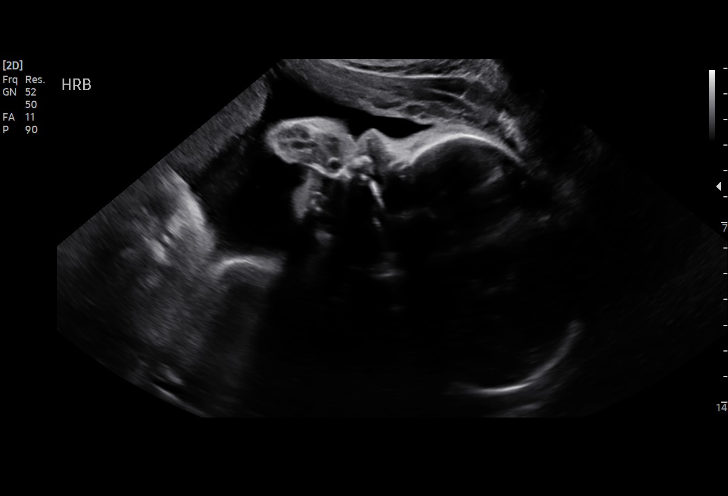
[im 45/51]
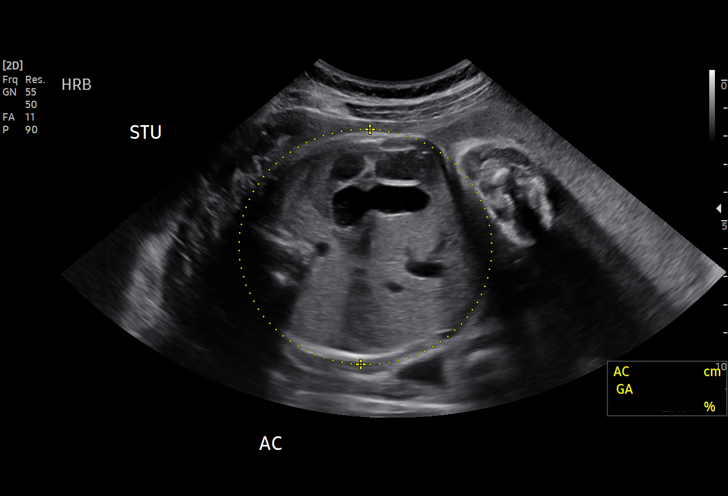
[im 49/51]
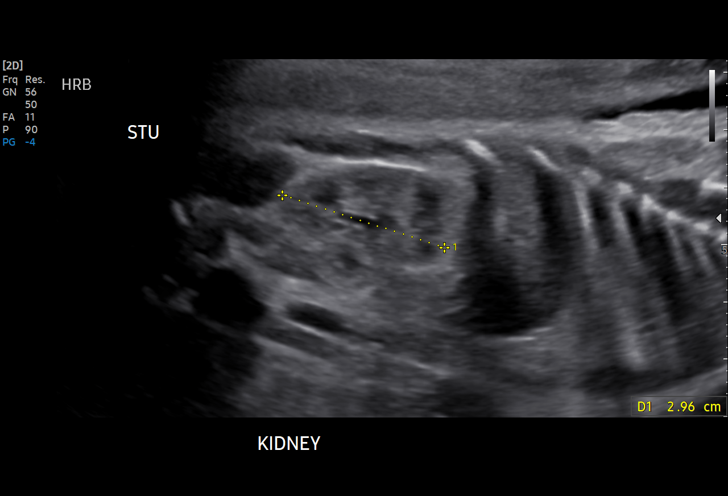

[13 of 28 positions shown; findings below may reference images not displayed]

Indications

 Cholestasis of pregnancy, third trimester      [id number]
 32 weeks gestation of pregnancy
 Encounter for other antenatal screening
 follow-up
Vital Signs

                                                Height:        5'2"
Fetal Evaluation

 Num Of Fetuses:         1
 Cardiac Activity:       Observed
 Presentation:           Cephalic
 Placenta:               Anterior
 P. Cord Insertion:      Previously Visualized

 Amniotic Fluid
 AFI FV:      Within normal limits

 AFI Sum(cm)     %Tile       Largest Pocket(cm)
 11.3            26

 RUQ(cm)       RLQ(cm)       LUQ(cm)        LLQ(cm)

Biophysical Evaluation

 Amniotic F.V:   Within normal limits       F. Tone:        Observed
 F. Movement:    Observed                   Score:          [DATE]
 F. Breathing:   Observed
Biometry

 BPD:      82.9  mm     G. Age:  33w 2d         73  %    CI:        76.86   %    70 - 86
                                                         FL/HC:      20.3   %    19.1 -
 HC:      299.5  mm     G. Age:  33w 1d         36  %    HC/AC:      1.05        0.96 -
 AC:      286.1  mm     G. Age:  32w 4d         60  %    FL/BPD:     73.5   %    71 - 87
 FL:       60.9  mm     G. Age:  31w 4d         21  %    FL/AC:      21.3   %    20 - 24

 Est. FW:    7506  gm      4 lb 6 oz     45  %
OB History

 Gravidity:    1
Gestational Age

 LMP:           32w 2d        Date:  08/23/19                 EDD:   05/29/20
 U/S Today:     32w 5d                                        EDD:   05/26/20
 Best:          32w 2d     Det. By:  LMP  (08/23/19)          EDD:   05/29/20
Anatomy

 Cranium:               Appears normal         Aortic Arch:            Previously seen
 Cavum:                 Appears normal         Ductal Arch:            Previously seen
 Ventricles:            Previously seen        Diaphragm:              Appears normal
 Choroid Plexus:        Previously seen        Stomach:                Appears normal, left
                                                                       sided
 Cerebellum:            Previously seen        Abdomen:                Appears normal
 Posterior Fossa:       Previously seen        Abdominal Wall:         Previously seen
 Nuchal Fold:           Previously seen        Cord Vessels:           Previously seen
 Face:                  Orbits and profile     Kidneys:                Appear normal
                        previously seen
 Lips:                  Previously seen        Bladder:                Appears normal
 Thoracic:              Appears normal         Spine:                  Previously seen
 Heart:                 Appears normal         Upper Extremities:      Previously seen
                        (4CH, axis, and
                        situs)
 RVOT:                  Appears normal         Lower Extremities:      Previously seen
 LVOT:                  Appears normal

 Other:  Heels and 5th digit visualized.
Cervix Uterus Adnexa

 Cervix
 Not visualized (advanced GA >26wks)
Comments

 This patient was seen for a follow up growth scan and
 biophysical profile due to cholestasis of pregnancy that is
 currently treated with ursodiol 500 mg twice a day.  The
 patient reports that her itching has almost completely
 resolved.  She is undergoing weekly fetal testing in your
 office.
 The fetal growth and amniotic fluid level appeared
 appropriate for her gestational age.
 A biophysical profile performed today was [DATE].
 Due to cholestasis of pregnancy, she should continue weekly
 fetal testing until delivery.

 Due to cholestasis of pregnancy, delivery is recommended at
 around 37 weeks.

 As she already has weekly fetal testing scheduled in your
 office, no further exams were scheduled in our office.
